# Patient Record
Sex: Male | Born: 1990 | Race: Black or African American | Hispanic: No | Marital: Single | Smoking: Current some day smoker
Health system: Southern US, Community
[De-identification: ages and names within clinical notes are randomized; demographics above are authoritative.]

---

## 2019-09-10 ENCOUNTER — Emergency Department (HOSPITAL_COMMUNITY): Payer: Self-pay | Admitting: Anesthesiology

## 2019-09-10 ENCOUNTER — Inpatient Hospital Stay (HOSPITAL_COMMUNITY)
Admission: EM | Admit: 2019-09-10 | Discharge: 2019-09-19 | DRG: 163 | Disposition: A | Discharge status: Home / Self Care | Payer: Self-pay | Attending: General Surgery | Admitting: General Surgery

## 2019-09-10 ENCOUNTER — Encounter (HOSPITAL_COMMUNITY): Admission: EM | Disposition: A | Discharge status: Home / Self Care | Payer: Self-pay | Source: Home / Self Care

## 2019-09-10 ENCOUNTER — Emergency Department (HOSPITAL_COMMUNITY): Payer: Self-pay

## 2019-09-10 DIAGNOSIS — J9690 Respiratory failure, unspecified, unspecified whether with hypoxia or hypercapnia: Secondary | ICD-10-CM

## 2019-09-10 DIAGNOSIS — S25811A Laceration of other blood vessels of thorax, right side, initial encounter: Secondary | ICD-10-CM | POA: Diagnosis present

## 2019-09-10 DIAGNOSIS — Z9689 Presence of other specified functional implants: Secondary | ICD-10-CM

## 2019-09-10 DIAGNOSIS — R000 Tachycardia, unspecified: Secondary | ICD-10-CM | POA: Diagnosis not present

## 2019-09-10 DIAGNOSIS — D62 Acute posthemorrhagic anemia: Secondary | ICD-10-CM | POA: Diagnosis present

## 2019-09-10 DIAGNOSIS — S27301A Unspecified injury of lung, unilateral, initial encounter: Secondary | ICD-10-CM

## 2019-09-10 DIAGNOSIS — D696 Thrombocytopenia, unspecified: Secondary | ICD-10-CM | POA: Diagnosis present

## 2019-09-10 DIAGNOSIS — R1110 Vomiting, unspecified: Secondary | ICD-10-CM | POA: Diagnosis not present

## 2019-09-10 DIAGNOSIS — J9601 Acute respiratory failure with hypoxia: Secondary | ICD-10-CM | POA: Diagnosis present

## 2019-09-10 DIAGNOSIS — R451 Restlessness and agitation: Secondary | ICD-10-CM | POA: Diagnosis present

## 2019-09-10 DIAGNOSIS — R404 Transient alteration of awareness: Secondary | ICD-10-CM | POA: Diagnosis present

## 2019-09-10 DIAGNOSIS — W3400XS Accidental discharge from unspecified firearms or gun, sequela: Secondary | ICD-10-CM

## 2019-09-10 DIAGNOSIS — S21139S Puncture wound without foreign body of unspecified front wall of thorax without penetration into thoracic cavity, sequela: Secondary | ICD-10-CM

## 2019-09-10 DIAGNOSIS — S21331A Puncture wound without foreign body of right front wall of thorax with penetration into thoracic cavity, initial encounter: Secondary | ICD-10-CM | POA: Diagnosis present

## 2019-09-10 DIAGNOSIS — R61 Generalized hyperhidrosis: Secondary | ICD-10-CM | POA: Diagnosis present

## 2019-09-10 DIAGNOSIS — T794XXA Traumatic shock, initial encounter: Secondary | ICD-10-CM | POA: Diagnosis present

## 2019-09-10 DIAGNOSIS — Z9911 Dependence on respirator [ventilator] status: Secondary | ICD-10-CM

## 2019-09-10 DIAGNOSIS — S271XXA Traumatic hemothorax, initial encounter: Secondary | ICD-10-CM | POA: Diagnosis present

## 2019-09-10 DIAGNOSIS — J942 Hemothorax: Secondary | ICD-10-CM

## 2019-09-10 DIAGNOSIS — Z4659 Encounter for fitting and adjustment of other gastrointestinal appliance and device: Secondary | ICD-10-CM

## 2019-09-10 DIAGNOSIS — S21131A Puncture wound without foreign body of right front wall of thorax without penetration into thoracic cavity, initial encounter: Secondary | ICD-10-CM

## 2019-09-10 DIAGNOSIS — Z20822 Contact with and (suspected) exposure to covid-19: Secondary | ICD-10-CM | POA: Diagnosis present

## 2019-09-10 DIAGNOSIS — Z9889 Other specified postprocedural states: Secondary | ICD-10-CM

## 2019-09-10 DIAGNOSIS — S27331A Laceration of lung, unilateral, initial encounter: Principal | ICD-10-CM | POA: Diagnosis present

## 2019-09-10 DIAGNOSIS — J189 Pneumonia, unspecified organism: Secondary | ICD-10-CM | POA: Diagnosis not present

## 2019-09-10 DIAGNOSIS — J9584 Transfusion-related acute lung injury (TRALI): Secondary | ICD-10-CM | POA: Diagnosis not present

## 2019-09-10 DIAGNOSIS — D689 Coagulation defect, unspecified: Secondary | ICD-10-CM | POA: Diagnosis present

## 2019-09-10 DIAGNOSIS — W3400XA Accidental discharge from unspecified firearms or gun, initial encounter: Secondary | ICD-10-CM

## 2019-09-10 DIAGNOSIS — T1490XA Injury, unspecified, initial encounter: Secondary | ICD-10-CM

## 2019-09-10 DIAGNOSIS — E876 Hypokalemia: Secondary | ICD-10-CM | POA: Diagnosis present

## 2019-09-10 DIAGNOSIS — E873 Alkalosis: Secondary | ICD-10-CM | POA: Diagnosis present

## 2019-09-10 DIAGNOSIS — J9602 Acute respiratory failure with hypercapnia: Secondary | ICD-10-CM | POA: Diagnosis present

## 2019-09-10 DIAGNOSIS — J939 Pneumothorax, unspecified: Secondary | ICD-10-CM

## 2019-09-10 DIAGNOSIS — S2231XA Fracture of one rib, right side, initial encounter for closed fracture: Secondary | ICD-10-CM | POA: Diagnosis present

## 2019-09-10 HISTORY — PX: THORACOTOMY: SHX5074

## 2019-09-10 HISTORY — PX: LOBECTOMY: SHX5089

## 2019-09-10 LAB — I-STAT CHEM 8, ED
BUN: 11 mg/dL (ref 8–23)
Calcium, Ion: 0.9 mmol/L — ABNORMAL LOW (ref 1.15–1.40)
Chloride: 109 mmol/L (ref 98–111)
Creatinine, Ser: 1.3 mg/dL — ABNORMAL HIGH (ref 0.61–1.24)
Glucose, Bld: 184 mg/dL — ABNORMAL HIGH (ref 70–99)
HCT: 30 % — ABNORMAL LOW (ref 39.0–52.0)
Hemoglobin: 10.2 g/dL — ABNORMAL LOW (ref 13.0–17.0)
Potassium: 3.7 mmol/L (ref 3.5–5.1)
Sodium: 139 mmol/L (ref 135–145)
TCO2: 15 mmol/L — ABNORMAL LOW (ref 22–32)

## 2019-09-10 LAB — RESPIRATORY PANEL BY RT PCR (FLU A&B, COVID)
Influenza A by PCR: NEGATIVE
Influenza B by PCR: NEGATIVE
SARS Coronavirus 2 by RT PCR: NEGATIVE

## 2019-09-10 SURGERY — THORACOTOMY, MAJOR
Anesthesia: General

## 2019-09-10 MED ORDER — CALCIUM CHLORIDE 10 % IV SOLN
INTRAVENOUS | Status: DC | PRN
  Administered 2019-09-10: 400 mg via INTRAVENOUS
  Administered 2019-09-10 (×2): 300 mg via INTRAVENOUS
  Administered 2019-09-10 (×2): 400 mg via INTRAVENOUS
  Administered 2019-09-10: 300 mg via INTRAVENOUS
  Administered 2019-09-10: 400 mg via INTRAVENOUS

## 2019-09-10 MED ORDER — KETAMINE HCL 50 MG/5ML IJ SOSY
PREFILLED_SYRINGE | INTRAMUSCULAR | Status: AC
  Filled 2019-09-10: qty 10

## 2019-09-10 MED ORDER — FENTANYL CITRATE (PF) 250 MCG/5ML IJ SOLN
INTRAMUSCULAR | Status: AC
  Filled 2019-09-10: qty 5

## 2019-09-10 MED ORDER — HEMOSTATIC AGENTS (NO CHARGE) OPTIME
TOPICAL | Status: DC | PRN
  Administered 2019-09-10 (×2): 1 via TOPICAL

## 2019-09-10 MED ORDER — FENTANYL CITRATE (PF) 100 MCG/2ML IJ SOLN
INTRAMUSCULAR | Status: AC
  Filled 2019-09-10: qty 2

## 2019-09-10 MED ORDER — VASOPRESSIN 20 UNIT/ML IV SOLN
INTRAVENOUS | Status: DC | PRN
  Administered 2019-09-10: 2 [IU] via INTRAVENOUS

## 2019-09-10 MED ORDER — VASOPRESSIN 20 UNIT/ML IV SOLN
INTRAVENOUS | Status: DC | PRN
  Administered 2019-09-10: .03 [IU]/min via INTRAVENOUS

## 2019-09-10 MED ORDER — ROCURONIUM BROMIDE 100 MG/10ML IV SOLN
INTRAVENOUS | Status: DC | PRN
  Administered 2019-09-10: 50 mg via INTRAVENOUS
  Administered 2019-09-10 (×2): 100 mg via INTRAVENOUS
  Administered 2019-09-10: 50 mg via INTRAVENOUS

## 2019-09-10 MED ORDER — SUCCINYLCHOLINE CHLORIDE 20 MG/ML IJ SOLN
INTRAMUSCULAR | Status: AC | PRN
  Administered 2019-09-10: 100 mg via INTRAVENOUS

## 2019-09-10 MED ORDER — SODIUM CHLORIDE 0.9 % IV SOLN
INTRAVENOUS | Status: AC | PRN
  Administered 2019-09-10: 1000 mL via INTRAVENOUS

## 2019-09-10 MED ORDER — SODIUM BICARBONATE 8.4 % IV SOLN
INTRAVENOUS | Status: DC | PRN
  Administered 2019-09-10 (×4): 50 meq via INTRAVENOUS

## 2019-09-10 MED ORDER — FENTANYL CITRATE (PF) 250 MCG/5ML IJ SOLN
INTRAMUSCULAR | Status: DC | PRN
  Administered 2019-09-10 (×3): 50 ug via INTRAVENOUS
  Administered 2019-09-10: 100 ug via INTRAVENOUS

## 2019-09-10 MED ORDER — CEFAZOLIN SODIUM-DEXTROSE 2-3 GM-%(50ML) IV SOLR
INTRAVENOUS | Status: DC | PRN
  Administered 2019-09-10: 2 g via INTRAVENOUS

## 2019-09-10 MED ORDER — SODIUM CHLORIDE 0.9 % IV SOLN: INTRAVENOUS | Status: DC | PRN

## 2019-09-10 MED ORDER — NOREPINEPHRINE 4 MG/250ML-% IV SOLN
INTRAVENOUS | Status: DC | PRN
  Administered 2019-09-10: 10 ug/min via INTRAVENOUS

## 2019-09-10 MED ORDER — MIDAZOLAM HCL 2 MG/2ML IJ SOLN
INTRAMUSCULAR | Status: AC
  Filled 2019-09-10: qty 2

## 2019-09-10 MED ORDER — VECURONIUM BROMIDE 10 MG IV SOLR
INTRAVENOUS | Status: AC
  Filled 2019-09-10: qty 10

## 2019-09-10 MED ORDER — IOHEXOL 300 MG/ML  SOLN
100.0000 mL | Freq: Once | INTRAMUSCULAR | Status: AC | PRN
  Administered 2019-09-10: 100 mL via INTRAVENOUS

## 2019-09-10 MED ORDER — COAGULATION FACTOR VIIA RECOMB 1 MG IV SOLR
90.0000 ug/kg | Freq: Once | INTRAVENOUS | Status: AC
  Administered 2019-09-10: 6120 ug via INTRAVENOUS
  Filled 2019-09-10: qty 6

## 2019-09-10 MED ORDER — VASOPRESSIN 20 UNIT/ML IV SOLN
INTRAVENOUS | Status: AC
  Filled 2019-09-10: qty 1

## 2019-09-10 MED ORDER — MIDAZOLAM HCL 2 MG/2ML IJ SOLN
INTRAMUSCULAR | Status: AC
  Filled 2019-09-10: qty 4

## 2019-09-10 MED ORDER — EPINEPHRINE 1 MG/10ML IJ SOSY
PREFILLED_SYRINGE | INTRAMUSCULAR | Status: AC | PRN
  Administered 2019-09-10: 1 via INTRAVENOUS

## 2019-09-10 MED ORDER — MIDAZOLAM HCL 5 MG/5ML IJ SOLN
INTRAMUSCULAR | Status: DC | PRN
  Administered 2019-09-10: 2 mg via INTRAVENOUS

## 2019-09-10 MED ORDER — MIDAZOLAM HCL 5 MG/5ML IJ SOLN
INTRAMUSCULAR | Status: AC | PRN
  Administered 2019-09-10: 2 mg via INTRAVENOUS

## 2019-09-10 MED ORDER — LACTATED RINGERS IV SOLN: INTRAVENOUS | Status: DC | PRN

## 2019-09-10 MED ORDER — FENTANYL CITRATE (PF) 100 MCG/2ML IJ SOLN
INTRAMUSCULAR | Status: AC | PRN
  Administered 2019-09-10: 100 ug via INTRAVENOUS

## 2019-09-10 MED ORDER — 0.9 % SODIUM CHLORIDE (POUR BTL) OPTIME
TOPICAL | Status: DC | PRN
  Administered 2019-09-10: 3000 mL

## 2019-09-10 MED ORDER — EPINEPHRINE 1 MG/10ML IJ SOSY
PREFILLED_SYRINGE | INTRAMUSCULAR | Status: DC | PRN
  Administered 2019-09-10: .5 mg via INTRAVENOUS

## 2019-09-10 SURGICAL SUPPLY — 84 items
APPLIER CLIP ROT 10 11.4 M/L (STAPLE) ×4
BIT DRILL 7/64X5 DISP (BIT) IMPLANT
BLADE CLIPPER SURG (BLADE) IMPLANT
CANISTER SUCT 3000ML PPV (MISCELLANEOUS) ×16 IMPLANT
CATH THORACIC 28FR (CATHETERS) IMPLANT
CATH THORACIC 28FR RT ANG (CATHETERS) IMPLANT
CATH THORACIC 36FR (CATHETERS) IMPLANT
CATH THORACIC 36FR RT ANG (CATHETERS) IMPLANT
CLIP APPLIE ROT 10 11.4 M/L (STAPLE) ×2 IMPLANT
CLIP VESOCCLUDE MED 6/CT (CLIP) ×4 IMPLANT
CNTNR URN SCR LID CUP LEK RST (MISCELLANEOUS) ×2 IMPLANT
CONN ST 1/4X3/8  BEN (MISCELLANEOUS) ×4
CONN ST 1/4X3/8 BEN (MISCELLANEOUS) ×4 IMPLANT
CONN Y 3/8X3/8X3/8  BEN (MISCELLANEOUS) ×2
CONN Y 3/8X3/8X3/8 BEN (MISCELLANEOUS) ×2 IMPLANT
CONT SPEC 4OZ STRL OR WHT (MISCELLANEOUS) ×2
COVER MAYO STAND STRL (DRAPES) ×4 IMPLANT
DERMABOND ADVANCED (GAUZE/BANDAGES/DRESSINGS)
DERMABOND ADVANCED .7 DNX12 (GAUZE/BANDAGES/DRESSINGS) IMPLANT
ELECT BLADE 6.5 EXT (BLADE) ×8 IMPLANT
ELECT REM PT RETURN 9FT ADLT (ELECTROSURGICAL) ×4
ELECTRODE REM PT RTRN 9FT ADLT (ELECTROSURGICAL) ×2 IMPLANT
FELT TEFLON 1X6 (MISCELLANEOUS) ×8 IMPLANT
GAUZE PACKING IODOFORM 1/4X15 (PACKING) ×4 IMPLANT
GAUZE SPONGE 4X4 12PLY STRL (GAUZE/BANDAGES/DRESSINGS) ×4 IMPLANT
GLOVE BIO SURGEON STRL SZ 6.5 (GLOVE) ×3 IMPLANT
GLOVE BIO SURGEONS STRL SZ 6.5 (GLOVE) ×1
GLOVE BIOGEL PI IND STRL 6 (GLOVE) ×4 IMPLANT
GLOVE BIOGEL PI IND STRL 6.5 (GLOVE) ×6 IMPLANT
GLOVE BIOGEL PI INDICATOR 6 (GLOVE) ×4
GLOVE BIOGEL PI INDICATOR 6.5 (GLOVE) ×6
GLOVE SURG SIGNA 7.5 PF LTX (GLOVE) ×4 IMPLANT
GOWN STRL REUS W/ TWL LRG LVL3 (GOWN DISPOSABLE) ×12 IMPLANT
GOWN STRL REUS W/ TWL XL LVL3 (GOWN DISPOSABLE) ×2 IMPLANT
GOWN STRL REUS W/TWL LRG LVL3 (GOWN DISPOSABLE) ×12
GOWN STRL REUS W/TWL XL LVL3 (GOWN DISPOSABLE) ×2
INSERT FOGARTY 61MM (MISCELLANEOUS) IMPLANT
KIT BASIN OR (CUSTOM PROCEDURE TRAY) ×4 IMPLANT
KIT SUCTION CATH 14FR (SUCTIONS) IMPLANT
KIT TURNOVER KIT B (KITS) ×4 IMPLANT
NS IRRIG 1000ML POUR BTL (IV SOLUTION) ×16 IMPLANT
PACK CHEST (CUSTOM PROCEDURE TRAY) ×4 IMPLANT
PAD ARMBOARD 7.5X6 YLW CONV (MISCELLANEOUS) ×8 IMPLANT
PASSER SUT SWANSON 36MM LOOP (INSTRUMENTS) ×4 IMPLANT
RELOAD STAPLER 60MM BLK (STAPLE) ×12 IMPLANT
RELOAD STAPLER GREEN 60MM (STAPLE) ×14 IMPLANT
SEALANT PATCH FIBRIN 2X4IN (MISCELLANEOUS) ×8 IMPLANT
SEALANT SURG COSEAL 8ML (VASCULAR PRODUCTS) IMPLANT
SPONGE TONSIL TAPE 1 RFD (DISPOSABLE) ×4 IMPLANT
STAPLE ECHEON FLEX 60 POW ENDO (STAPLE) ×4 IMPLANT
STAPLE RELOAD 2.5MM WHITE (STAPLE) ×8 IMPLANT
STAPLER RELOAD 60MM BLK (STAPLE) ×24
STAPLER RELOAD GREEN 60MM (STAPLE) ×28
STAPLER VASCULAR ECHELON 35 (CUTTER) ×4 IMPLANT
STAPLER VISISTAT 35W (STAPLE) ×4 IMPLANT
STOPCOCK 4 WAY LG BORE MALE ST (IV SETS) IMPLANT
SUT PROLENE 2 0 MH 48 (SUTURE) ×32 IMPLANT
SUT PROLENE 2 0 SH DA (SUTURE) IMPLANT
SUT PROLENE 3 0 SH 48 (SUTURE) IMPLANT
SUT PROLENE 4 0 RB 1 (SUTURE) ×8
SUT PROLENE 4 0 SH DA (SUTURE) ×28 IMPLANT
SUT PROLENE 4-0 RB1 .5 CRCL 36 (SUTURE) ×8 IMPLANT
SUT SILK  1 MH (SUTURE) ×6
SUT SILK 1 MH (SUTURE) ×6 IMPLANT
SUT SILK 2 0SH CR/8 30 (SUTURE) ×8 IMPLANT
SUT VIC AB 1 CTX 18 (SUTURE) IMPLANT
SUT VIC AB 1 CTX 36 (SUTURE) ×8
SUT VIC AB 1 CTX36XBRD ANBCTR (SUTURE) ×8 IMPLANT
SUT VIC AB 2-0 CTX 36 (SUTURE) ×8 IMPLANT
SUT VIC AB 3-0 X1 27 (SUTURE) IMPLANT
SUT VICRYL 2 TP 1 (SUTURE) ×8 IMPLANT
SWAB CULTURE ESWAB REG 1ML (MISCELLANEOUS) IMPLANT
SYR 10ML LL (SYRINGE) IMPLANT
SYR 20ML LL LF (SYRINGE) IMPLANT
SYR 50ML LL SCALE MARK (SYRINGE) IMPLANT
SYSTEM SAHARA CHEST DRAIN ATS (WOUND CARE) IMPLANT
TAPE CLOTH SURG 4X10 WHT LF (GAUZE/BANDAGES/DRESSINGS) ×4 IMPLANT
TOWEL GREEN STERILE (TOWEL DISPOSABLE) ×8 IMPLANT
TRAY FOLEY SLVR 16FR LF STAT (SET/KITS/TRAYS/PACK) ×4 IMPLANT
TUBE CONNECTING 12'X1/4 (SUCTIONS) ×1
TUBE CONNECTING 12X1/4 (SUCTIONS) ×3 IMPLANT
TUBING EXTENTION W/L.L. (IV SETS) IMPLANT
WATER STERILE IRR 1000ML POUR (IV SOLUTION) ×4 IMPLANT
YANKAUER SUCT BULB TIP NO VENT (SUCTIONS) ×4 IMPLANT

## 2019-09-10 NOTE — ED Notes (Signed)
First unit of blood done

## 2019-09-10 NOTE — Anesthesia Preprocedure Evaluation (Signed)
Anesthesia Evaluation  Patient identified by MRN, date of birth, ID band  Reviewed: Unable to perform ROS - Chart review onlyPreop documentation limited or incomplete due to emergent nature of procedure.  Airway Mallampati: Intubated       Dental   Pulmonary     + decreased breath sounds      Cardiovascular  Rate:Tachycardia     Neuro/Psych    GI/Hepatic   Endo/Other    Renal/GU      Musculoskeletal   Abdominal Normal abdominal exam  (+)   Peds  Hematology   Anesthesia Other Findings   Reproductive/Obstetrics                             Anesthesia Physical Anesthesia Plan  ASA: IV and emergent  Anesthesia Plan: General   Post-op Pain Management:    Induction: Intravenous  PONV Risk Score and Plan: 1 and Ondansetron  Airway Management Planned:   Additional Equipment: Arterial line and CVP  Intra-op Plan:   Post-operative Plan: Post-operative intubation/ventilation  Informed Consent:     Only emergency history available and History available from chart only  Plan Discussed with: CRNA  Anesthesia Plan Comments:         Anesthesia Quick Evaluation
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
## 2019-09-10 NOTE — Procedures (Signed)
Central Venous Catheter Insertion Procedure Note Justin Morton [id number] 05/09/1874  Procedure: Insertion of Central Venous Catheter Indications: Hemorrhagic shock  Procedure Details The right groin was prepped with ChloraPrep.  Ultrasound was used to confirm vascular anatomy.  Using Seldinger Technique, a Cordis was placed in the right femoral vein.  This is secured to the skin with silk sutures and a dressing is applied.  This line was placed under duress and will need to be removed soon as possible.  [name] 09/10/2019, 11:08 PM
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
## 2019-09-10 NOTE — ED Notes (Signed)
MTP blood documentation  RBC Unit: [id number] 21 [id number] Z  [id number]  RBC Unit [id number] 21 [id number] W 2045-2046  Plasma Unit [id number] 21 [id number] J 2047-2049  Plasma Unit [id number] 21 [id number] N 2049-2049  RBC Unit [id number] 21 [id number] C 2049-2050  RBC Unit [id number] 21 [id number] 6 2050-2052  RBC Unit [id number] 21 [id number] M 2102-2105  Signed off by Pattricia Boss RN and Victorino Dike RN

## 2019-09-10 NOTE — ED Notes (Signed)
Pt to the OR.

## 2019-09-10 NOTE — ED Notes (Signed)
70mg  kedimine

## 2019-09-10 NOTE — ED Notes (Signed)
Cooler of blood here

## 2019-09-10 NOTE — ED Notes (Addendum)
Begin to start intubation:  100 Ketamine given
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
## 2019-09-10 NOTE — ED Notes (Addendum)
Started emergency release blood  [id number]

## 2019-09-10 NOTE — ED Notes (Signed)
Back from CT

## 2019-09-10 NOTE — ED Notes (Signed)
Second blood unit complete

## 2019-09-10 NOTE — ED Notes (Signed)
3rd unit of blood complete
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
## 2019-09-10 NOTE — Progress Notes (Signed)
Patient transported to OR.

## 2019-09-10 NOTE — ED Notes (Addendum)
Second unit of blood started [id number]

## 2019-09-10 NOTE — ED Notes (Addendum)
MTP ordered per Trauma Provider, called blood bank and Charge RN

## 2019-09-10 NOTE — Anesthesia Procedure Notes (Signed)
Arterial Line Insertion Start/End5/08/2019 9:18 PM, 09/10/2019 9:20 PM Performed by: Shelton Silvas, MD, anesthesiologist  Patient location: Pre-op. Preanesthetic checklist: patient identified, IV checked, site marked, risks and benefits discussed, surgical consent, monitors and equipment checked, pre-op evaluation, timeout performed and anesthesia consent Lidocaine 1% used for infiltration Left, radial was placed Catheter size: 20 Fr Hand hygiene performed  and maximum sterile barriers used   Attempts: 1 Procedure performed without using ultrasound guided technique. Following insertion, dressing applied. Post procedure assessment: normal and unchanged  Patient tolerated the procedure well with no immediate complications.
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
## 2019-09-10 NOTE — ED Notes (Signed)
Chest tube inserted on Right side

## 2019-09-10 NOTE — Consult Note (Signed)
Responded to page, pt unavailable, no family present, prayed for pt, staff will call again if further need of chaplain services.   Rev. Donnel Saxon Chaplain

## 2019-09-10 NOTE — ED Notes (Addendum)
Started 3rd until of blood [id number] , called blood bank for more.

## 2019-09-11 ENCOUNTER — Inpatient Hospital Stay (HOSPITAL_COMMUNITY): Payer: Self-pay

## 2019-09-11 LAB — POCT I-STAT 7, (LYTES, BLD GAS, ICA,H+H)
Acid-Base Excess: 0 mmol/L (ref 0.0–2.0)
Acid-Base Excess: 11 mmol/L — ABNORMAL HIGH (ref 0.0–2.0)
Acid-base deficit: 10 mmol/L — ABNORMAL HIGH (ref 0.0–2.0)
Acid-base deficit: 10 mmol/L — ABNORMAL HIGH (ref 0.0–2.0)
Acid-base deficit: 13 mmol/L — ABNORMAL HIGH (ref 0.0–2.0)
Acid-base deficit: 18 mmol/L — ABNORMAL HIGH (ref 0.0–2.0)
Acid-base deficit: 7 mmol/L — ABNORMAL HIGH (ref 0.0–2.0)
Bicarbonate: 14.6 mmol/L — ABNORMAL LOW (ref 20.0–28.0)
Bicarbonate: 20.3 mmol/L (ref 20.0–28.0)
Bicarbonate: 22 mmol/L (ref 20.0–28.0)
Bicarbonate: 22.9 mmol/L (ref 20.0–28.0)
Bicarbonate: 24.4 mmol/L (ref 20.0–28.0)
Bicarbonate: 26.8 mmol/L (ref 20.0–28.0)
Bicarbonate: 33.1 mmol/L — ABNORMAL HIGH (ref 20.0–28.0)
Calcium, Ion: 0.51 mmol/L — CL (ref 1.15–1.40)
Calcium, Ion: 0.76 mmol/L — CL (ref 1.15–1.40)
Calcium, Ion: 0.76 mmol/L — CL (ref 1.15–1.40)
Calcium, Ion: 0.84 mmol/L — CL (ref 1.15–1.40)
Calcium, Ion: 1.4 mmol/L (ref 1.15–1.40)
Calcium, Ion: <0.3 mmol/L — CL (ref 1.15–1.40)
Calcium, Ion: 1.26 mmol/L (ref 1.15–1.40)
HCT: 25 % — ABNORMAL LOW (ref 39.0–52.0)
HCT: 33 % — ABNORMAL LOW (ref 39.0–52.0)
HCT: 34 % — ABNORMAL LOW (ref 39.0–52.0)
HCT: 34 % — ABNORMAL LOW (ref 39.0–52.0)
HCT: 36 % — ABNORMAL LOW (ref 39.0–52.0)
HCT: 36 % — ABNORMAL LOW (ref 39.0–52.0)
HCT: 23 % — ABNORMAL LOW (ref 39.0–52.0)
Hemoglobin: 11.2 g/dL — ABNORMAL LOW (ref 13.0–17.0)
Hemoglobin: 11.6 g/dL — ABNORMAL LOW (ref 13.0–17.0)
Hemoglobin: 11.6 g/dL — ABNORMAL LOW (ref 13.0–17.0)
Hemoglobin: 12.2 g/dL — ABNORMAL LOW (ref 13.0–17.0)
Hemoglobin: 12.2 g/dL — ABNORMAL LOW (ref 13.0–17.0)
Hemoglobin: 8.5 g/dL — ABNORMAL LOW (ref 13.0–17.0)
Hemoglobin: 7.8 g/dL — ABNORMAL LOW (ref 13.0–17.0)
O2 Saturation: 56 %
O2 Saturation: 75 %
O2 Saturation: 80 %
O2 Saturation: 82 %
O2 Saturation: 83 %
O2 Saturation: 87 %
O2 Saturation: 100 %
Patient temperature: 33.5
Patient temperature: 33.6
Patient temperature: 34
Patient temperature: 34
Patient temperature: 34.5
Patient temperature: 36.8
Potassium: 4.3 mmol/L (ref 3.5–5.1)
Potassium: 5 mmol/L (ref 3.5–5.1)
Potassium: 5.1 mmol/L (ref 3.5–5.1)
Potassium: 6 mmol/L — ABNORMAL HIGH (ref 3.5–5.1)
Potassium: 6.2 mmol/L — ABNORMAL HIGH (ref 3.5–5.1)
Potassium: 6.9 mmol/L (ref 3.5–5.1)
Potassium: 3.5 mmol/L (ref 3.5–5.1)
Sodium: 141 mmol/L (ref 135–145)
Sodium: 146 mmol/L — ABNORMAL HIGH (ref 135–145)
Sodium: 147 mmol/L — ABNORMAL HIGH (ref 135–145)
Sodium: 148 mmol/L — ABNORMAL HIGH (ref 135–145)
Sodium: 149 mmol/L — ABNORMAL HIGH (ref 135–145)
Sodium: 150 mmol/L — ABNORMAL HIGH (ref 135–145)
Sodium: 148 mmol/L — ABNORMAL HIGH (ref 135–145)
TCO2: 17 mmol/L — ABNORMAL LOW (ref 22–32)
TCO2: 23 mmol/L (ref 22–32)
TCO2: 25 mmol/L (ref 22–32)
TCO2: 26 mmol/L (ref 22–32)
TCO2: 27 mmol/L (ref 22–32)
TCO2: 28 mmol/L (ref 22–32)
TCO2: 34 mmol/L — ABNORMAL HIGH (ref 22–32)
pCO2 arterial: 48.5 mmHg — ABNORMAL HIGH (ref 32.0–48.0)
pCO2 arterial: 70.2 mmHg (ref 32.0–48.0)
pCO2 arterial: 73.9 mmHg (ref 32.0–48.0)
pCO2 arterial: 76.5 mmHg (ref 32.0–48.0)
pCO2 arterial: 81.2 mmHg (ref 32.0–48.0)
pCO2 arterial: 85.1 mmHg (ref 32.0–48.0)
pCO2 arterial: 33.2 mmHg (ref 32.0–48.0)
pH, Arterial: 6.926 — CL (ref 7.350–7.450)
pH, Arterial: 6.967 — CL (ref 7.350–7.450)
pH, Arterial: 7.017 — CL (ref 7.350–7.450)
pH, Arterial: 7.06 — CL (ref 7.350–7.450)
pH, Arterial: 7.108 — CL (ref 7.350–7.450)
pH, Arterial: 7.336 — ABNORMAL LOW (ref 7.350–7.450)
pH, Arterial: 7.606 (ref 7.350–7.450)
pO2, Arterial: 41 mmHg — ABNORMAL LOW (ref 83.0–108.0)
pO2, Arterial: 49 mmHg — ABNORMAL LOW (ref 83.0–108.0)
pO2, Arterial: 55 mmHg — ABNORMAL LOW (ref 83.0–108.0)
pO2, Arterial: 56 mmHg — ABNORMAL LOW (ref 83.0–108.0)
pO2, Arterial: 56 mmHg — ABNORMAL LOW (ref 83.0–108.0)
pO2, Arterial: 65 mmHg — ABNORMAL LOW (ref 83.0–108.0)
pO2, Arterial: 268 mmHg — ABNORMAL HIGH (ref 83.0–108.0)

## 2019-09-11 LAB — GLUCOSE, CAPILLARY
Glucose-Capillary: 123 mg/dL — ABNORMAL HIGH (ref 70–99)
Glucose-Capillary: 127 mg/dL — ABNORMAL HIGH (ref 70–99)
Glucose-Capillary: 154 mg/dL — ABNORMAL HIGH (ref 70–99)
Glucose-Capillary: 188 mg/dL — ABNORMAL HIGH (ref 70–99)
Glucose-Capillary: 56 mg/dL — ABNORMAL LOW (ref 70–99)
Glucose-Capillary: 56 mg/dL — ABNORMAL LOW (ref 70–99)
Glucose-Capillary: 93 mg/dL (ref 70–99)

## 2019-09-11 LAB — POCT I-STAT, CHEM 8
BUN: 11 mg/dL (ref 6–20)
Calcium, Ion: 0.9 mmol/L — ABNORMAL LOW (ref 1.15–1.40)
Chloride: 109 mmol/L (ref 98–111)
Creatinine, Ser: 1.3 mg/dL — ABNORMAL HIGH (ref 0.61–1.24)
Glucose, Bld: 184 mg/dL — ABNORMAL HIGH (ref 70–99)
HCT: 30 % — ABNORMAL LOW (ref 39.0–52.0)
Hemoglobin: 10.2 g/dL — ABNORMAL LOW (ref 13.0–17.0)
Potassium: 3.7 mmol/L (ref 3.5–5.1)
Sodium: 139 mmol/L (ref 135–145)
TCO2: 15 mmol/L — ABNORMAL LOW (ref 22–32)

## 2019-09-11 LAB — COMPREHENSIVE METABOLIC PANEL
ALT: 64 U/L — ABNORMAL HIGH (ref 0–44)
AST: 101 U/L — ABNORMAL HIGH (ref 15–41)
Albumin: 2.4 g/dL — ABNORMAL LOW (ref 3.5–5.0)
Alkaline Phosphatase: 42 U/L (ref 38–126)
Anion gap: 10 (ref 5–15)
BUN: 9 mg/dL (ref 6–20)
CO2: 27 mmol/L (ref 22–32)
Calcium: 10.8 mg/dL — ABNORMAL HIGH (ref 8.9–10.3)
Chloride: 113 mmol/L — ABNORMAL HIGH (ref 98–111)
Creatinine, Ser: 0.9 mg/dL (ref 0.61–1.24)
GFR calc Af Amer: >60 mL/min (ref >60)
GFR calc non Af Amer: >60 mL/min (ref >60)
Glucose, Bld: 172 mg/dL — ABNORMAL HIGH (ref 70–99)
Potassium: 5.7 mmol/L — ABNORMAL HIGH (ref 3.5–5.1)
Sodium: 150 mmol/L — ABNORMAL HIGH (ref 135–145)
Total Bilirubin: 0.9 mg/dL (ref 0.3–1.2)
Total Protein: 4.3 g/dL — ABNORMAL LOW (ref 6.5–8.1)

## 2019-09-11 LAB — CBC
HCT: 36.8 % — ABNORMAL LOW (ref 39.0–52.0)
HCT: 27.8 % — ABNORMAL LOW (ref 39.0–52.0)
HCT: 24.7 % — ABNORMAL LOW (ref 39.0–52.0)
Hemoglobin: 12.3 g/dL — ABNORMAL LOW (ref 13.0–17.0)
Hemoglobin: 9.7 g/dL — ABNORMAL LOW (ref 13.0–17.0)
Hemoglobin: 8.5 g/dL — ABNORMAL LOW (ref 13.0–17.0)
MCH: 28.9 pg (ref 26.0–34.0)
MCH: 28.9 pg (ref 26.0–34.0)
MCH: 28.5 pg (ref 26.0–34.0)
MCHC: 33.4 g/dL (ref 30.0–36.0)
MCHC: 34.9 g/dL (ref 30.0–36.0)
MCHC: 34.4 g/dL (ref 30.0–36.0)
MCV: 86.4 fL (ref 80.0–100.0)
MCV: 82.7 fL (ref 80.0–100.0)
MCV: 82.9 fL (ref 80.0–100.0)
Platelets: 94 10*3/uL — ABNORMAL LOW (ref 150–400)
Platelets: 78 10*3/uL — ABNORMAL LOW (ref 150–400)
Platelets: 141 10*3/uL — ABNORMAL LOW (ref 150–400)
RBC: 4.26 MIL/uL (ref 4.22–5.81)
RBC: 3.36 MIL/uL — ABNORMAL LOW (ref 4.22–5.81)
RBC: 2.98 MIL/uL — ABNORMAL LOW (ref 4.22–5.81)
RDW: 14.8 % (ref 11.5–15.5)
RDW: 14.6 % (ref 11.5–15.5)
RDW: 15.1 % (ref 11.5–15.5)
WBC: 8.2 10*3/uL (ref 4.0–10.5)
WBC: 9 10*3/uL (ref 4.0–10.5)
WBC: 12.3 10*3/uL — ABNORMAL HIGH (ref 4.0–10.5)
nRBC: 0 % (ref 0.0–0.2)
nRBC: 0.2 % (ref 0.0–0.2)
nRBC: 0 % (ref 0.0–0.2)

## 2019-09-11 LAB — DIC (DISSEMINATED INTRAVASCULAR COAGULATION)PANEL
D-Dimer, Quant: >20 ug/mL-FEU — ABNORMAL HIGH (ref 0.00–0.50)
Fibrinogen: 305 mg/dL (ref 210–475)
INR: 0.9 (ref 0.8–1.2)
Platelets: 96 10*3/uL — ABNORMAL LOW (ref 150–400)
Prothrombin Time: 11.4 seconds (ref 11.4–15.2)
Smear Review: NONE SEEN
aPTT: 47 seconds — ABNORMAL HIGH (ref 24–36)

## 2019-09-11 LAB — BASIC METABOLIC PANEL
Anion gap: 8 (ref 5–15)
BUN: 9 mg/dL (ref 6–20)
CO2: 27 mmol/L (ref 22–32)
Calcium: 9.8 mg/dL (ref 8.9–10.3)
Chloride: 115 mmol/L — ABNORMAL HIGH (ref 98–111)
Creatinine, Ser: 1.07 mg/dL (ref 0.61–1.24)
GFR calc Af Amer: >60 mL/min (ref >60)
GFR calc non Af Amer: >60 mL/min (ref >60)
Glucose, Bld: 92 mg/dL (ref 70–99)
Potassium: 3.4 mmol/L — ABNORMAL LOW (ref 3.5–5.1)
Sodium: 150 mmol/L — ABNORMAL HIGH (ref 135–145)

## 2019-09-11 LAB — GLOBAL TEG PANEL
CFF Max Amplitude: 21.6 mm (ref 15–32)
CK with Heparinase (R): 4.5 min (ref 4.3–8.3)
Citrated Functional Fibrinogen: 394.2 mg/dL (ref 278–581)
Citrated Kaolin (K): 0.9 min (ref 0.8–2.1)
Citrated Kaolin (MA): 61.9 mm (ref 52–69)
Citrated Kaolin (R): 4.7 min (ref 4.6–9.1)
Citrated Kaolin Angle: 76.5 deg (ref 63–78)
Citrated Rapid TEG (MA): 60.3 mm (ref 52–70)

## 2019-09-11 LAB — MRSA PCR SCREENING: MRSA by PCR: NEGATIVE

## 2019-09-11 LAB — TRIGLYCERIDES: Triglycerides: 100 mg/dL (ref <150)

## 2019-09-11 LAB — ABO/RH: ABO/RH(D): O POS

## 2019-09-11 MED ORDER — SODIUM CHLORIDE 0.9% IV SOLUTION: Freq: Once | INTRAVENOUS | Status: AC

## 2019-09-11 MED ORDER — ONDANSETRON HCL 4 MG/2ML IJ SOLN
4.0000 mg | Freq: Four times a day (QID) | INTRAMUSCULAR | Status: DC | PRN
  Administered 2019-09-14 – 2019-09-15 (×2): 4 mg via INTRAVENOUS
  Filled 2019-09-11 (×2): qty 2

## 2019-09-11 MED ORDER — SUCCINYLCHOLINE CHLORIDE 200 MG/10ML IV SOSY
PREFILLED_SYRINGE | INTRAVENOUS | Status: AC
  Filled 2019-09-11: qty 10

## 2019-09-11 MED ORDER — ROCURONIUM BROMIDE 10 MG/ML (PF) SYRINGE
PREFILLED_SYRINGE | INTRAVENOUS | Status: AC
  Filled 2019-09-11: qty 50

## 2019-09-11 MED ORDER — POTASSIUM CHLORIDE 20 MEQ/15ML (10%) PO SOLN
20.0000 meq | Freq: Every day | ORAL | Status: DC
  Filled 2019-09-11: qty 15

## 2019-09-11 MED ORDER — VITAL HIGH PROTEIN PO LIQD
1000.0000 mL | ORAL | Status: DC
Start: 2019-09-11 — End: 2019-09-11

## 2019-09-11 MED ORDER — POTASSIUM CHLORIDE 20 MEQ PO PACK
20.0000 meq | PACK | Freq: Once | ORAL | Status: DC
  Filled 2019-09-11 (×2): qty 1

## 2019-09-11 MED ORDER — POTASSIUM CHLORIDE 20 MEQ/15ML (10%) PO SOLN
20.0000 meq | Freq: Once | ORAL | Status: AC
  Administered 2019-09-11: 20 meq

## 2019-09-11 MED ORDER — IPRATROPIUM-ALBUTEROL 0.5-2.5 (3) MG/3ML IN SOLN
3.0000 mL | Freq: Four times a day (QID) | RESPIRATORY_TRACT | Status: DC
  Administered 2019-09-11 – 2019-09-13 (×11): 3 mL via RESPIRATORY_TRACT
  Filled 2019-09-11 (×11): qty 3

## 2019-09-11 MED ORDER — EPINEPHRINE 1 MG/10ML IJ SOSY
PREFILLED_SYRINGE | INTRAMUSCULAR | Status: AC
  Filled 2019-09-11: qty 20

## 2019-09-11 MED ORDER — FENTANYL 2500MCG IN NS 250ML (10MCG/ML) PREMIX INFUSION
50.0000 ug/h | INTRAVENOUS | Status: DC
  Administered 2019-09-11 (×2): 150 ug/h via INTRAVENOUS
  Administered 2019-09-12 – 2019-09-14 (×3): 200 ug/h via INTRAVENOUS
  Filled 2019-09-11 (×7): qty 250

## 2019-09-11 MED ORDER — FENTANYL CITRATE (PF) 100 MCG/2ML IJ SOLN: 50.0000 ug | Freq: Once | INTRAMUSCULAR | Status: AC

## 2019-09-11 MED ORDER — FUROSEMIDE 10 MG/ML IJ SOLN
40.0000 mg | Freq: Once | INTRAMUSCULAR | Status: AC
  Administered 2019-09-11: 40 mg via INTRAVENOUS
  Filled 2019-09-11: qty 4

## 2019-09-11 MED ORDER — SODIUM BICARBONATE 8.4 % IV SOLN
INTRAVENOUS | Status: AC
  Filled 2019-09-11: qty 50

## 2019-09-11 MED ORDER — SODIUM CHLORIDE 0.9 % IV SOLN: INTRAVENOUS | Status: DC

## 2019-09-11 MED ORDER — PRO-STAT SUGAR FREE PO LIQD
60.0000 mL | Freq: Two times a day (BID) | ORAL | Status: DC
  Administered 2019-09-11 – 2019-09-15 (×8): 60 mL
  Filled 2019-09-11 (×8): qty 60

## 2019-09-11 MED ORDER — PIVOT 1.5 CAL PO LIQD
1000.0000 mL | ORAL | Status: DC
  Administered 2019-09-11 – 2019-09-13 (×3): 1000 mL
  Filled 2019-09-11 (×6): qty 1000

## 2019-09-11 MED ORDER — CHLORHEXIDINE GLUCONATE 0.12% ORAL RINSE (MEDLINE KIT)
15.0000 mL | Freq: Two times a day (BID) | OROMUCOSAL | Status: DC
  Administered 2019-09-11 – 2019-09-15 (×9): 15 mL via OROMUCOSAL

## 2019-09-11 MED ORDER — PRO-STAT SUGAR FREE PO LIQD
30.0000 mL | Freq: Two times a day (BID) | ORAL | Status: DC
  Filled 2019-09-11: qty 30

## 2019-09-11 MED ORDER — SODIUM CHLORIDE 0.9% FLUSH: 10.0000 mL | INTRAVENOUS | Status: DC | PRN

## 2019-09-11 MED ORDER — PANTOPRAZOLE SODIUM 40 MG PO TBEC
40.0000 mg | DELAYED_RELEASE_TABLET | Freq: Every day | ORAL | Status: DC
  Administered 2019-09-12: 40 mg via ORAL
  Filled 2019-09-11 (×2): qty 1

## 2019-09-11 MED ORDER — ORAL CARE MOUTH RINSE
15.0000 mL | OROMUCOSAL | Status: DC
  Administered 2019-09-11 – 2019-09-16 (×48): 15 mL via OROMUCOSAL

## 2019-09-11 MED ORDER — PHENYLEPHRINE 40 MCG/ML (10ML) SYRINGE FOR IV PUSH (FOR BLOOD PRESSURE SUPPORT)
PREFILLED_SYRINGE | INTRAVENOUS | Status: AC
  Filled 2019-09-11: qty 10

## 2019-09-11 MED ORDER — ENOXAPARIN SODIUM 30 MG/0.3ML ~~LOC~~ SOLN
30.0000 mg | Freq: Two times a day (BID) | SUBCUTANEOUS | Status: DC
  Administered 2019-09-12 – 2019-09-18 (×14): 30 mg via SUBCUTANEOUS
  Filled 2019-09-11 (×15): qty 0.3

## 2019-09-11 MED ORDER — DEXTROSE 50 % IV SOLN
25.0000 mL | Freq: Once | INTRAVENOUS | Status: AC
  Administered 2019-09-11: 25 mL via INTRAVENOUS

## 2019-09-11 MED ORDER — POTASSIUM CHLORIDE 10 MEQ/100ML IV SOLN
10.0000 meq | INTRAVENOUS | Status: AC
  Administered 2019-09-11 (×4): 10 meq via INTRAVENOUS
  Filled 2019-09-11 (×4): qty 100

## 2019-09-11 MED ORDER — CALCIUM CHLORIDE 10 % IV SOLN
INTRAVENOUS | Status: AC
  Filled 2019-09-11: qty 20

## 2019-09-11 MED ORDER — SODIUM CHLORIDE 0.9% FLUSH
10.0000 mL | Freq: Two times a day (BID) | INTRAVENOUS | Status: DC
  Administered 2019-09-11 – 2019-09-12 (×4): 10 mL
  Administered 2019-09-12: 30 mL
  Administered 2019-09-13 – 2019-09-19 (×8): 10 mL

## 2019-09-11 MED ORDER — FENTANYL BOLUS VIA INFUSION
50.0000 ug | INTRAVENOUS | Status: DC | PRN
  Filled 2019-09-11: qty 50

## 2019-09-11 MED ORDER — ACETAMINOPHEN 500 MG PO TABS
1000.0000 mg | ORAL_TABLET | Freq: Four times a day (QID) | ORAL | Status: DC
  Administered 2019-09-11 – 2019-09-12 (×5): 1000 mg
  Filled 2019-09-11 (×3): qty 2

## 2019-09-11 MED ORDER — CEFAZOLIN SODIUM-DEXTROSE 2-4 GM/100ML-% IV SOLN
2.0000 g | Freq: Three times a day (TID) | INTRAVENOUS | Status: AC
  Administered 2019-09-11 – 2019-09-12 (×6): 2 g via INTRAVENOUS
  Filled 2019-09-11 (×9): qty 100

## 2019-09-11 MED ORDER — ONDANSETRON 4 MG PO TBDP
4.0000 mg | ORAL_TABLET | Freq: Four times a day (QID) | ORAL | Status: DC | PRN
  Filled 2019-09-11: qty 1

## 2019-09-11 MED ORDER — ARTIFICIAL TEARS OPHTHALMIC OINT
TOPICAL_OINTMENT | OPHTHALMIC | Status: AC
  Filled 2019-09-11: qty 3.5

## 2019-09-11 MED ORDER — LIDOCAINE 2% (20 MG/ML) 5 ML SYRINGE
INTRAMUSCULAR | Status: AC
  Filled 2019-09-11: qty 5

## 2019-09-11 MED ORDER — LACTATED RINGERS IV SOLN: INTRAVENOUS | Status: DC

## 2019-09-11 MED ORDER — DEXTROSE 50 % IV SOLN
INTRAVENOUS | Status: AC
  Administered 2019-09-11: 50 mL
  Filled 2019-09-11: qty 50

## 2019-09-11 MED ORDER — PANTOPRAZOLE SODIUM 40 MG IV SOLR
40.0000 mg | Freq: Every day | INTRAVENOUS | Status: DC
  Administered 2019-09-11 – 2019-09-15 (×4): 40 mg via INTRAVENOUS
  Filled 2019-09-11 (×4): qty 40

## 2019-09-11 MED ORDER — PROPOFOL 1000 MG/100ML IV EMUL
5.0000 ug/kg/min | INTRAVENOUS | Status: DC
  Administered 2019-09-11 (×2): 50 ug/kg/min via INTRAVENOUS
  Administered 2019-09-11 (×2): 40 ug/kg/min via INTRAVENOUS
  Administered 2019-09-11: 60 ug/kg/min via INTRAVENOUS
  Administered 2019-09-12: 25 ug/kg/min via INTRAVENOUS
  Administered 2019-09-12: 35 ug/kg/min via INTRAVENOUS
  Administered 2019-09-12: 40 ug/kg/min via INTRAVENOUS
  Administered 2019-09-13: 15 ug/kg/min via INTRAVENOUS
  Filled 2019-09-11 (×2): qty 100
  Filled 2019-09-11: qty 200
  Filled 2019-09-11 (×2): qty 100
  Filled 2019-09-11: qty 200
  Filled 2019-09-11: qty 100

## 2019-09-11 MED ORDER — METHOCARBAMOL 500 MG PO TABS
1000.0000 mg | ORAL_TABLET | Freq: Three times a day (TID) | ORAL | Status: DC
  Administered 2019-09-11 – 2019-09-16 (×17): 1000 mg
  Filled 2019-09-11 (×15): qty 2

## 2019-09-11 MED ORDER — CHLORHEXIDINE GLUCONATE CLOTH 2 % EX PADS
6.0000 | MEDICATED_PAD | Freq: Every day | CUTANEOUS | Status: DC
  Administered 2019-09-11 – 2019-09-16 (×6): 6 via TOPICAL

## 2019-09-11 NOTE — Progress Notes (Signed)
OT Cancellation Note  Patient Details Name: Justin Morton MRN: [id number] DOB: 25-Apr-1991   Cancelled Treatment:    Reason Eval/Treat Not Completed: Patient not medically ready.  OT to continue to follow for OT eval.   Flora Lipps, OTR/L Acute Rehabilitation Services Pager: [phone] Office: [phone]   Justin Morton C 09/11/2019, 9:37 AM

## 2019-09-11 NOTE — Progress Notes (Signed)
PT Cancellation Note  Patient Details Name: Justin Morton MRN: [id number] DOB: May 16, 1990   Cancelled Treatment:    Reason Eval/Treat Not Completed: Patient not medically ready. Pt intubated and sedated after OR.   Angelina Ok [facility] 09/11/2019, 9:32 AM Skip Mayer PT Acute Rehabilitation Services Pager [phone] Office [phone]

## 2019-09-11 NOTE — Progress Notes (Signed)
-  Trauma/Critical Care Follow Up Note  Subjective:    Overnight Issues:   Objective:  Vital signs for last 24 hours: Temp:  [93 F (33.9 C)-98.6 F (37 C)] 97.7 F (36.5 C) (05/05 0910) Pulse Rate:  [35-123] 66 (05/05 0900) Resp:  [0-33] 18 (05/05 0910) BP: (59-141)/(35-99) 141/83 (05/05 0809) SpO2:  [88 %-100 %] 97 % (05/05 0900) Arterial Line BP: (104-191)/(74-111) 132/77 (05/05 0910) FiO2 (%):  [80 %-100 %] 80 % (05/05 0809) Weight:  [68 kg] 68 kg (05/04 2028)  Hemodynamic parameters for last 24 hours: CVP:  [13 mmHg-15 mmHg] 13 mmHg  Intake/Output from previous day: 05/04 0701 - 05/05 0700 In: 26119.8 [I.V.:8700.2; Blood:17319.5; IV Piggyback:100.1] Out: 57017 [Urine:1225; Emesis/NG output:250; Blood:15000; Chest Tube:1170]  Intake/Output this shift: Total I/O In: 227.8 [I.V.:208.9; IV Piggyback:18.9] Out: 125 [Urine:75; Chest Tube:50]  Vent settings for last 24 hours: Vent Mode: PRVC FiO2 (%):  [80 %-100 %] 80 % Set Rate:  [18 bmp-30 bmp] 18 bmp Vt Set:  [420 mL-560 mL] 420 mL PEEP:  [5 cmH20-10 cmH20] 5 cmH20 Plateau Pressure:  [22 cmH20-42 cmH20] 23 cmH20  Physical Exam:  Gen: comfortable, no distress Neuro: sedated, does not follow commands HEENT: intubated Neck: supple CV: RRR, R chest with CT x2, 1.1L SS o/p, incision dressed with moderate staining, but appears dried Pulm: unlabored breathing, mechanically ventilated Abd: soft, nontender GU: clear, yellow urine Extr: wwp, no edema of extremities, but 1+ facial edema   Results for orders placed or performed during the hospital encounter of 09/10/19 (from the past 24 hour(s))  Type and screen Ordered by PROVIDER DEFAULT     Status: None (Preliminary result)   Collection Time: 09/10/19  8:12 PM  Result Value Ref Range   ABO/RH(D) O POS    Antibody Screen NEG    Sample Expiration      09/13/2019,2359 Performed at [facility], [address], [place], [place] [address]    Unit Number  [id number]    Blood Component Type RED CELLS,LR    Unit division 00    Status of Unit ISSUED    Unit tag comment EMERGENCY RELEASE    Transfusion Status OK TO TRANSFUSE    Crossmatch Result COMPATIBLE    Unit Number [id number]    Blood Component Type RED CELLS,LR    Unit division 00    Status of Unit ISSUED    Unit tag comment EMERGENCY RELEASE    Transfusion Status OK TO TRANSFUSE    Crossmatch Result COMPATIBLE    Unit Number [id number]    Blood Component Type RED CELLS,LR    Unit division 00    Status of Unit ISSUED    Unit tag comment EMERGENCY RELEASE    Transfusion Status OK TO TRANSFUSE    Crossmatch Result COMPATIBLE    Unit Number [id number]    Blood Component Type RED CELLS,LR    Unit division 00    Status of Unit ISSUED    Unit tag comment EMERGENCY RELEASE    Transfusion Status OK TO TRANSFUSE    Crossmatch Result COMPATIBLE    Unit Number [id number]    Blood Component Type RED CELLS,LR    Unit division 00    Status of Unit ISSUED    Transfusion Status OK TO TRANSFUSE    Crossmatch Result Compatible    Unit Number [id number]    Blood Component Type RED CELLS,LR    Unit division 00    Status of Unit ISSUED  Transfusion Status OK TO TRANSFUSE    Crossmatch Result Compatible    Unit Number [id number]    Blood Component Type RED CELLS,LR    Unit division 00    Status of Unit ISSUED    Transfusion Status OK TO TRANSFUSE    Crossmatch Result Compatible    Unit Number [id number]    Blood Component Type RED CELLS,LR    Unit division 00    Status of Unit ISSUED    Transfusion Status OK TO TRANSFUSE    Crossmatch Result Compatible    Unit Number [id number]    Blood Component Type RED CELLS,LR    Unit division 00    Status of Unit ISSUED    Transfusion Status OK TO TRANSFUSE    Crossmatch Result Compatible    Unit Number [id number]    Blood Component Type RED CELLS,LR    Unit division 00    Status of Unit ISSUED     Transfusion Status OK TO TRANSFUSE    Crossmatch Result Compatible    Unit Number [id number]    Blood Component Type RED CELLS,LR    Unit division 00    Status of Unit ISSUED    Transfusion Status OK TO TRANSFUSE    Crossmatch Result Compatible    Unit Number [id number]    Blood Component Type RED CELLS,LR    Unit division 00    Status of Unit ISSUED    Transfusion Status OK TO TRANSFUSE    Crossmatch Result Compatible    Unit Number [id number]    Blood Component Type RED CELLS,LR    Unit division 00    Status of Unit ISSUED    Transfusion Status OK TO TRANSFUSE    Crossmatch Result Compatible    Unit Number [id number]    Blood Component Type RED CELLS,LR    Unit division 00    Status of Unit ISSUED    Transfusion Status OK TO TRANSFUSE    Crossmatch Result Compatible    Unit Number [id number]    Blood Component Type RED CELLS,LR    Unit division 00    Status of Unit ISSUED    Transfusion Status OK TO TRANSFUSE    Crossmatch Result Compatible    Unit Number [id number]    Blood Component Type RED CELLS,LR    Unit division 00    Status of Unit ISSUED    Transfusion Status OK TO TRANSFUSE    Crossmatch Result Compatible    Unit Number [id number]    Blood Component Type RBC LR PHER2    Unit division 00    Status of Unit ISSUED    Transfusion Status OK TO TRANSFUSE    Crossmatch Result Compatible    Unit Number [id number]    Blood Component Type RED CELLS,LR    Unit division 00    Status of Unit ISSUED    Transfusion Status OK TO TRANSFUSE    Crossmatch Result Compatible    Unit Number [id number]    Blood Component Type RED CELLS,LR    Unit division 00    Status of Unit ISSUED    Transfusion Status OK TO TRANSFUSE    Crossmatch Result Compatible    Unit Number [id number]    Blood Component Type RED CELLS,LR    Unit division 00    Status of Unit ISSUED    Transfusion Status OK TO TRANSFUSE    Crossmatch Result Compatible    Unit  Number [id number]    Blood Component Type  RBC LR PHER2    Unit division 00    Status of Unit ISSUED    Transfusion Status OK TO TRANSFUSE    Crossmatch Result Compatible    Unit Number [id number]    Blood Component Type RED CELLS,LR    Unit division 00    Status of Unit ISSUED    Transfusion Status OK TO TRANSFUSE    Crossmatch Result Compatible    Unit Number [id number]    Blood Component Type RED CELLS,LR    Unit division 00    Status of Unit ISSUED    Transfusion Status OK TO TRANSFUSE    Crossmatch Result Compatible    Unit Number [id number]    Blood Component Type RBC LR PHER1    Unit division 00    Status of Unit ISSUED    Transfusion Status OK TO TRANSFUSE    Crossmatch Result Compatible    Unit Number [id number]    Blood Component Type RBC LR PHER2    Unit division 00    Status of Unit ISSUED    Transfusion Status OK TO TRANSFUSE    Crossmatch Result Compatible    Unit Number [id number]    Blood Component Type RBC LR PHER2    Unit division 00    Status of Unit ISSUED    Transfusion Status OK TO TRANSFUSE    Crossmatch Result Compatible    Unit Number [id number]    Blood Component Type RED CELLS,LR    Unit division 00    Status of Unit ISSUED    Transfusion Status OK TO TRANSFUSE    Crossmatch Result Compatible    Unit Number [id number]    Blood Component Type RED CELLS,LR    Unit division 00    Status of Unit ISSUED    Transfusion Status OK TO TRANSFUSE    Crossmatch Result Compatible    Unit Number [id number]    Blood Component Type RED CELLS,LR    Unit division 00    Status of Unit REL FROM [facility]    Transfusion Status OK TO TRANSFUSE    Crossmatch Result Compatible    Unit Number [id number]    Blood Component Type RED CELLS,LR    Unit division 00    Status of Unit REL FROM [facility]    Transfusion Status OK TO TRANSFUSE    Crossmatch Result Compatible    Unit Number [id number]    Blood Component Type RED CELLS,LR     Unit division 00    Status of Unit REL FROM [facility]    Transfusion Status OK TO TRANSFUSE    Crossmatch Result Compatible    Unit Number [id number]    Blood Component Type RED CELLS,LR    Unit division 00    Status of Unit REL FROM [facility]    Transfusion Status OK TO TRANSFUSE    Crossmatch Result Compatible    Unit Number [id number]    Blood Component Type RED CELLS,LR    Unit division 00    Status of Unit REL FROM [facility]    Transfusion Status OK TO TRANSFUSE    Crossmatch Result Compatible    Unit Number [id number]    Blood Component Type RED CELLS,LR    Unit division 00    Status of Unit REL FROM [facility]    Transfusion Status OK TO TRANSFUSE    Crossmatch Result Compatible    Unit Number [id number]    Blood Component Type RED CELLS,LR    Unit division 00  [facility]    Transfusion Status OK TO TRANSFUSE    Crossmatch Result Compatible    Unit Number [id number]    Blood Component Type RED CELLS,LR    Unit division 00    [facility]    Transfusion Status OK TO TRANSFUSE    Crossmatch Result Compatible    Unit Number [id number]    Blood Component Type RED CELLS,LR    Unit division 00    [facility]    Transfusion Status OK TO TRANSFUSE    Crossmatch Result Compatible    Unit Number [id number]    Blood Component Type RBC LR PHER2    Unit division 00    [facility]    Transfusion Status OK TO TRANSFUSE    Crossmatch Result Compatible    Unit Number [id number]    Blood Component Type RED CELLS,LR    Unit division 00    Status of Unit ALLOCATED    Transfusion Status OK TO TRANSFUSE    Crossmatch Result Compatible    Unit Number [id number]    Blood Component Type RED CELLS,LR    Unit division 00    Status of Unit ALLOCATED    Transfusion Status OK TO TRANSFUSE    Crossmatch Result Compatible    Unit Number [id number]    Blood Component Type RED CELLS,LR    Unit  division 00    Status of Unit ALLOCATED    Transfusion Status OK TO TRANSFUSE    Crossmatch Result Compatible    Unit Number [id number]    Blood Component Type RED CELLS,LR    Unit division 00    Status of Unit ALLOCATED    Transfusion Status OK TO TRANSFUSE    Crossmatch Result Compatible    Unit Number [id number]    Blood Component Type RED CELLS,LR    Unit division 00    [facility]    Transfusion Status OK TO TRANSFUSE    Crossmatch Result Compatible    Unit Number [id number]    Blood Component Type RED CELLS,LR    Unit division 00    [facility]    Transfusion Status OK TO TRANSFUSE    Crossmatch Result Compatible    Unit Number [id number]    Blood Component Type RED CELLS,LR    Unit division 00    [facility]    Transfusion Status OK TO TRANSFUSE    Crossmatch Result Compatible    Unit Number [id number]    Blood Component Type RED CELLS,LR    Unit division 00    [facility]    Transfusion Status OK TO TRANSFUSE    Crossmatch Result Compatible    Unit Number [id number]    Blood Component Type RED CELLS,LR    Unit division 00    Status of Unit ISSUED    Transfusion Status OK TO TRANSFUSE    Crossmatch Result COMPATIBLE    Unit Number [id number]    Blood Component Type RED CELLS,LR    Unit division 00    Status of Unit ISSUED    Transfusion Status OK TO TRANSFUSE    Crossmatch Result COMPATIBLE    Unit Number [id number]    Blood Component Type RED CELLS,LR    Unit division 00    Status of Unit ISSUED    Transfusion  Status OK TO TRANSFUSE    Crossmatch Result COMPATIBLE   ABO/Rh     Status: None   Collection Time: 09/10/19  8:12 PM  Result Value Ref Range   ABO/RH(D)      O POS Performed at [facility], [address] [place], [place] [address]   I-Stat Chem 8, ED     Status: Abnormal   Collection Time: 09/10/19  8:19 PM  Result Value Ref Range    Sodium 139 135 - 145 mmol/L   Potassium 3.7 3.5 - 5.1 mmol/L   Chloride 109 98 - 111 mmol/L   BUN 11 8 - 23 mg/dL   Creatinine, Ser 6.04 (H) 0.61 - 1.24 mg/dL   Glucose, Bld 540 (H) 70 - 99 mg/dL   Calcium, Ion 9.81 (L) 1.15 - 1.40 mmol/L   TCO2 15 (L) 22 - 32 mmol/L   Hemoglobin 10.2 (L) 13.0 - 17.0 g/dL   HCT 19.1 (L) 47.8 - 29.5 %  I-STAT, chem 8     Status: Abnormal   Collection Time: 09/10/19  8:19 PM  Result Value Ref Range   Sodium 139 135 - 145 mmol/L   Potassium 3.7 3.5 - 5.1 mmol/L   Chloride 109 98 - 111 mmol/L   BUN 11 6 - 20 mg/dL   Creatinine, Ser 6.21 (H) 0.61 - 1.24 mg/dL   Glucose, Bld 308 (H) 70 - 99 mg/dL   Calcium, Ion 6.57 (L) 1.15 - 1.40 mmol/L   TCO2 15 (L) 22 - 32 mmol/L   Hemoglobin 10.2 (L) 13.0 - 17.0 g/dL   HCT 84.6 (L) 96.2 - 95.2 %  Prepare fresh frozen plasma     Status: None (Preliminary result)   Collection Time: 09/10/19  8:24 PM  Result Value Ref Range   Unit Number [id number]    Blood Component Type THAWED PLASMA    Unit division 00    Status of Unit ISSUED    Unit tag comment EMERGENCY RELEASE    Transfusion Status OK TO TRANSFUSE    Unit Number [id number]    Blood Component Type THAWED PLASMA    Unit division 00    Status of Unit ISSUED    Unit tag comment EMERGENCY RELEASE    Transfusion Status OK TO TRANSFUSE    Unit Number [id number]    Blood Component Type THW PLS APHR    Unit division 00    Status of Unit ISSUED    Transfusion Status OK TO TRANSFUSE    Unit Number [id number]    Blood Component Type THW PLS APHR    Unit division A0    Status of Unit ISSUED    Transfusion Status OK TO TRANSFUSE    Unit Number [id number]    Blood Component Type THAWED PLASMA    Unit division 00    Status of Unit ISSUED    Unit tag comment VERBAL ORDERS PER DR LITTLE    Transfusion Status OK TO TRANSFUSE    Unit Number [id number]    Blood Component Type THAWED PLASMA    Unit division 00    Status of Unit ISSUED    Unit  tag comment VERBAL ORDERS PER DR LITTLE    Transfusion Status OK TO TRANSFUSE    Unit Number [id number]    Blood Component Type THW PLS APHR    Unit division A0    Status of Unit ISSUED    Unit tag comment VERBAL ORDERS PER DR LITTLE  Transfusion Status OK TO TRANSFUSE    Unit Number [id number]    Blood Component Type THW PLS APHR    Unit division 00    Status of Unit ISSUED    Unit tag comment VERBAL ORDERS PER DR LITTLE    Transfusion Status OK TO TRANSFUSE    Unit Number [id number]    Blood Component Type THAWED PLASMA    Unit division 00    Status of Unit ISSUED    Transfusion Status OK TO TRANSFUSE    Unit Number [id number]    Blood Component Type THAWED PLASMA    Unit division 00    Status of Unit ISSUED    Transfusion Status OK TO TRANSFUSE    Unit Number [id number]    Blood Component Type THAWED PLASMA    Unit division 00    Status of Unit ISSUED    Transfusion Status OK TO TRANSFUSE    Unit Number [id number]    Blood Component Type THW PLS APHR    Unit division B0    Status of Unit ISSUED    Transfusion Status OK TO TRANSFUSE    Unit Number [id number]    Blood Component Type THAWED PLASMA    Unit division 00    Status of Unit ISSUED    Transfusion Status OK TO TRANSFUSE    Unit Number [id number]    Blood Component Type THW PLS APHR    Unit division B0    Status of Unit ISSUED    Transfusion Status OK TO TRANSFUSE    Unit Number [id number]    Blood Component Type THAWED PLASMA    Unit division 00    Status of Unit ISSUED    Transfusion Status OK TO TRANSFUSE    Unit Number [id number]    Blood Component Type THW PLS APHR    Unit division B0    Status of Unit ISSUED    Transfusion Status OK TO TRANSFUSE    Unit Number [id number]    Blood Component Type THAWED PLASMA    Unit division 00    Status of Unit ISSUED    Transfusion Status OK TO TRANSFUSE    Unit Number [id number]    Blood Component Type THW PLS APHR     Unit division A0    Status of Unit ISSUED    Transfusion Status OK TO TRANSFUSE    Unit Number [id number]    Blood Component Type THW PLS APHR    Unit division B0    Status of Unit ISSUED    Transfusion Status OK TO TRANSFUSE    Unit Number [id number]    Blood Component Type THW PLS APHR    Unit division C0    Status of Unit ISSUED    Transfusion Status OK TO TRANSFUSE    Unit Number [id number]    Blood Component Type THW PLS APHR    Unit division A0    Status of Unit REL FROM [facility]    Transfusion Status OK TO TRANSFUSE    Unit Number [id number]    Blood Component Type THW PLS APHR    Unit division B0    Status of Unit REL FROM [facility]    Transfusion Status OK TO TRANSFUSE    Unit Number [id number]    Blood Component Type THW PLS APHR    Unit division B0    Status of Unit REL FROM [facility]    Transfusion Status OK TO TRANSFUSE    Unit Number [id number]  Blood Component Type THAWED PLASMA    Unit division 00    Status of Unit REL FROM [facility]    Transfusion Status OK TO TRANSFUSE    Unit Number [id number]    Blood Component Type THAWED PLASMA    Unit division 00    Status of Unit ISSUED    Transfusion Status OK TO TRANSFUSE    Unit Number [id number]    Blood Component Type THW PLS APHR    Unit division A0    Status of Unit ISSUED    Transfusion Status OK TO TRANSFUSE   Respiratory Panel by RT PCR (Flu A&B, Covid) - Nasopharyngeal Swab     Status: None   Collection Time: 09/10/19  8:30 PM   Specimen: Nasopharyngeal Swab  Result Value Ref Range   SARS Coronavirus 2 by RT PCR NEGATIVE NEGATIVE   Influenza A by PCR NEGATIVE NEGATIVE   Influenza B by PCR NEGATIVE NEGATIVE  Prepare cryoprecipitate     Status: None (Preliminary result)   Collection Time: 09/10/19  8:56 PM  Result Value Ref Range   Unit Number [id number]    Blood Component Type CRYPOOL THAW    Unit division 00    Status of Unit ISSUED    Transfusion Status OK TO TRANSFUSE    Unit  Number [id number]    Blood Component Type CRYPOOL THAW    Unit division 00    Status of Unit ISSUED    Transfusion Status OK TO TRANSFUSE    Unit Number [id number]    Blood Component Type CRYPOOL THAW    Unit division 00    Status of Unit ISSUED    Transfusion Status OK TO TRANSFUSE    Unit Number [id number]    Blood Component Type CRYPOOL THAW    Unit division 00    Status of Unit ISSUED    Transfusion Status OK TO TRANSFUSE    Unit Number [id number]    Blood Component Type CRYPOOL THAW    Unit division 00    Status of Unit ISSUED    Transfusion Status OK TO TRANSFUSE   Prepare platelet pheresis     Status: None (Preliminary result)   Collection Time: 09/10/19  8:57 PM  Result Value Ref Range   Unit Number [id number]    Blood Component Type PLTP LR3 PAS    Unit division 00    Status of Unit ISSUED    Transfusion Status OK TO TRANSFUSE    Unit Number [id number]    Blood Component Type PLTP LR2 PAS    Unit division 00    Status of Unit ISSUED    Transfusion Status OK TO TRANSFUSE    Unit Number [id number]    Blood Component Type PLTP LR1 PAS    Unit division 00    Status of Unit ISSUED    Transfusion Status OK TO TRANSFUSE    Unit Number [id number]    Blood Component Type PLTP LR1 PAS    Unit division 00    Status of Unit ISSUED    Transfusion Status OK TO TRANSFUSE    Unit Number [id number]    Blood Component Type PLTPH LI3 PAS    Unit division 00    Status of Unit ISSUED    Transfusion Status OK TO TRANSFUSE   I-STAT 7, (LYTES, BLD GAS, ICA, H+H)     Status: Abnormal   Collection Time: 09/10/19  9:34 PM  Result Value Ref Range   pH, Arterial 6.926 (LL)  7.350 - 7.450   pCO2 arterial 70.2 (HH) 32.0 - 48.0 mmHg   pO2, Arterial 65 (L) 83.0 - 108.0 mmHg   Bicarbonate 14.6 (L) 20.0 - 28.0 mmol/L   TCO2 17 (L) 22 - 32 mmol/L   O2 Saturation 75.0 %   Acid-base deficit 18.0 (H) 0.0 - 2.0 mmol/L   Sodium 141 135 - 145 mmol/L   Potassium 6.9  (HH) 3.5 - 5.1 mmol/L   Calcium, Ion <0.30 (LL) 1.15 - 1.40 mmol/L   HCT 36.0 (L) 39.0 - 52.0 %   Hemoglobin 12.2 (L) 13.0 - 17.0 g/dL   Sample type ARTERIAL   I-STAT 7, (LYTES, BLD GAS, ICA, H+H)     Status: Abnormal   Collection Time: 09/10/19 10:00 PM  Result Value Ref Range   pH, Arterial 6.967 (LL) 7.350 - 7.450   pCO2 arterial 85.1 (HH) 32.0 - 48.0 mmHg   pO2, Arterial 41 (L) 83.0 - 108.0 mmHg   Bicarbonate 20.3 20.0 - 28.0 mmol/L   TCO2 23 22 - 32 mmol/L   O2 Saturation 56.0 %   Acid-base deficit 13.0 (H) 0.0 - 2.0 mmol/L   Sodium 146 (H) 135 - 145 mmol/L   Potassium 6.2 (H) 3.5 - 5.1 mmol/L   Calcium, Ion 0.51 (LL) 1.15 - 1.40 mmol/L   HCT 34.0 (L) 39.0 - 52.0 %   Hemoglobin 11.6 (L) 13.0 - 17.0 g/dL   Patient temperature 38.4 C    Sample type ARTERIAL   I-STAT 7, (LYTES, BLD GAS, ICA, H+H)     Status: Abnormal   Collection Time: 09/10/19 10:33 PM  Result Value Ref Range   pH, Arterial 7.108 (LL) 7.350 - 7.450   pCO2 arterial 73.9 (HH) 32.0 - 48.0 mmHg   pO2, Arterial 55 (L) 83.0 - 108.0 mmHg   Bicarbonate 24.4 20.0 - 28.0 mmol/L   TCO2 27 22 - 32 mmol/L   O2 Saturation 83.0 %   Acid-base deficit 7.0 (H) 0.0 - 2.0 mmol/L   Sodium 149 (H) 135 - 145 mmol/L   Potassium 4.3 3.5 - 5.1 mmol/L   Calcium, Ion 0.84 (LL) 1.15 - 1.40 mmol/L   HCT 34.0 (L) 39.0 - 52.0 %   Hemoglobin 11.6 (L) 13.0 - 17.0 g/dL   Patient temperature 66.5 C    Sample type ARTERIAL   I-STAT 7, (LYTES, BLD GAS, ICA, H+H)     Status: Abnormal   Collection Time: 09/10/19 11:17 PM  Result Value Ref Range   pH, Arterial 7.017 (LL) 7.350 - 7.450   pCO2 arterial 81.2 (HH) 32.0 - 48.0 mmHg   pO2, Arterial 56 (L) 83.0 - 108.0 mmHg   Bicarbonate 22.0 20.0 - 28.0 mmol/L   TCO2 25 22 - 32 mmol/L   O2 Saturation 80.0 %   Acid-base deficit 10.0 (H) 0.0 - 2.0 mmol/L   Sodium 148 (H) 135 - 145 mmol/L   Potassium 5.0 3.5 - 5.1 mmol/L   Calcium, Ion 0.76 (LL) 1.15 - 1.40 mmol/L   HCT 25.0 (L) 39.0 - 52.0 %    Hemoglobin 8.5 (L) 13.0 - 17.0 g/dL   Patient temperature 99.3 C    Sample type ARTERIAL   I-STAT 7, (LYTES, BLD GAS, ICA, H+H)     Status: Abnormal   Collection Time: 09/10/19 11:42 PM  Result Value Ref Range   pH, Arterial 7.060 (LL) 7.350 - 7.450   pCO2 arterial 76.5 (HH) 32.0 - 48.0 mmHg   pO2, Arterial 56 (L) 83.0 - 108.0  mmHg   Bicarbonate 22.9 20.0 - 28.0 mmol/L   TCO2 26 22 - 32 mmol/L   O2 Saturation 82.0 %   Acid-base deficit 10.0 (H) 0.0 - 2.0 mmol/L   Sodium 147 (H) 135 - 145 mmol/L   Potassium 6.0 (H) 3.5 - 5.1 mmol/L   Calcium, Ion 0.76 (LL) 1.15 - 1.40 mmol/L   HCT 36.0 (L) 39.0 - 52.0 %   Hemoglobin 12.2 (L) 13.0 - 17.0 g/dL   Patient temperature 33.5 C    Sample type ARTERIAL   CBC     Status: Abnormal   Collection Time: 09/11/19 12:48 AM  Result Value Ref Range   WBC 8.2 4.0 - 10.5 K/uL   RBC 4.26 4.22 - 5.81 MIL/uL   Hemoglobin 12.3 (L) 13.0 - 17.0 g/dL   HCT 36.8 (L) 39.0 - 52.0 %   MCV 86.4 80.0 - 100.0 fL   MCH 28.9 26.0 - 34.0 pg   MCHC 33.4 30.0 - 36.0 g/dL   RDW 14.8 11.5 - 15.5 %   Platelets 94 (L) 150 - 400 K/uL   nRBC 0.0 0.0 - 0.2 %  Comprehensive metabolic panel     Status: Abnormal   Collection Time: 09/11/19 12:48 AM  Result Value Ref Range   Sodium 150 (H) 135 - 145 mmol/L   Potassium 5.7 (H) 3.5 - 5.1 mmol/L   Chloride 113 (H) 98 - 111 mmol/L   CO2 27 22 - 32 mmol/L   Glucose, Bld 172 (H) 70 - 99 mg/dL   BUN 9 6 - 20 mg/dL   Creatinine, Ser 0.90 0.61 - 1.24 mg/dL   Calcium 10.8 (H) 8.9 - 10.3 mg/dL   Total Protein 4.3 (L) 6.5 - 8.1 g/dL   Albumin 2.4 (L) 3.5 - 5.0 g/dL   AST 101 (H) 15 - 41 U/L   ALT 64 (H) 0 - 44 U/L   Alkaline Phosphatase 42 38 - 126 U/L   Total Bilirubin 0.9 0.3 - 1.2 mg/dL   GFR calc non Af Amer >60 >60 mL/min   GFR calc Af Amer >60 >60 mL/min   Anion gap 10 5 - 15  Triglycerides     Status: None   Collection Time: 09/11/19 12:48 AM  Result Value Ref Range   Triglycerides 100 <150 mg/dL  MRSA PCR  Screening     Status: None   Collection Time: 09/11/19  1:00 AM   Specimen: Nasopharyngeal  Result Value Ref Range   MRSA by PCR NEGATIVE NEGATIVE  I-STAT 7, (LYTES, BLD GAS, ICA, H+H)     Status: Abnormal   Collection Time: 09/11/19  1:06 AM  Result Value Ref Range   pH, Arterial 7.336 (L) 7.350 - 7.450   pCO2 arterial 48.5 (H) 32.0 - 48.0 mmHg   pO2, Arterial 49 (L) 83.0 - 108.0 mmHg   Bicarbonate 26.8 20.0 - 28.0 mmol/L   TCO2 28 22 - 32 mmol/L   O2 Saturation 87.0 %   Acid-Base Excess 0.0 0.0 - 2.0 mmol/L   Sodium 150 (H) 135 - 145 mmol/L   Potassium 5.1 3.5 - 5.1 mmol/L   Calcium, Ion 1.40 1.15 - 1.40 mmol/L   HCT 33.0 (L) 39.0 - 52.0 %   Hemoglobin 11.2 (L) 13.0 - 17.0 g/dL   Patient temperature 34.0 C    Sample type ARTERIAL   Glucose, capillary     Status: Abnormal   Collection Time: 09/11/19  1:07 AM  Result Value Ref Range   Glucose-Capillary  154 (H) 70 - 99 mg/dL  DIC (disseminated intravasc coag) panel     Status: Abnormal   Collection Time: 09/11/19  1:09 AM  Result Value Ref Range   Prothrombin Time 11.4 11.4 - 15.2 seconds   INR 0.9 0.8 - 1.2   aPTT 47 (H) 24 - 36 seconds   Fibrinogen 305 210 - 475 mg/dL   D-Dimer, Quant >16.10 (H) 0.00 - 0.50 ug/mL-FEU   Platelets 96 (L) 150 - 400 K/uL   Smear Review NO SCHISTOCYTES

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
## 2019-09-11 NOTE — Progress Notes (Signed)
Airway pressures on 8cc Vt (560) were high: PIP 48, plateau 42.  Dropped Vt to 6cc (420) with some improvement: PIP 40, plat 35.  ABG on 420/30/100%/10+ 7.34/48.5/49/26.8.  Dr. Fredricka Bonine aware.
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
## 2019-09-12 ENCOUNTER — Inpatient Hospital Stay (HOSPITAL_COMMUNITY): Payer: Self-pay

## 2019-09-12 LAB — BPAM FFP
Blood Product Expiration Date: 202105042359
Blood Product Expiration Date: 202105042359
Blood Product Expiration Date: 202105092359
Blood Product Expiration Date: 202105092359
Blood Product Expiration Date: 202105092359
Blood Product Expiration Date: 202105092359
Blood Product Expiration Date: 202105092359
Blood Product Expiration Date: 202105092359
Blood Product Expiration Date: 202105092359
Blood Product Expiration Date: 202105092359
Blood Product Expiration Date: 202105092359
Blood Product Expiration Date: 202105092359
Blood Product Expiration Date: 202105092359
Blood Product Expiration Date: 202105092359
Blood Product Expiration Date: 202105092359
Blood Product Expiration Date: 202105092359
Blood Product Expiration Date: 202105092359
Blood Product Expiration Date: 202105092359
Blood Product Expiration Date: 202105092359
Blood Product Expiration Date: 202105092359
Blood Product Expiration Date: 202105092359
Blood Product Expiration Date: 202105092359
Blood Product Expiration Date: 202105092359
Blood Product Expiration Date: 202105092359
Blood Product Expiration Date: 202105102359
Blood Product Expiration Date: 202105102359
Blood Product Expiration Date: 202105092359
Blood Product Expiration Date: 202105092359
ISSUE DATE / TIME: 202105042026
ISSUE DATE / TIME: 202105042026
ISSUE DATE / TIME: 202105042052
ISSUE DATE / TIME: 202105042052
ISSUE DATE / TIME: 202105042107
ISSUE DATE / TIME: 202105042107
ISSUE DATE / TIME: 202105042107
ISSUE DATE / TIME: 202105042107
ISSUE DATE / TIME: 202105042146
ISSUE DATE / TIME: 202105042146
ISSUE DATE / TIME: 202105042146
ISSUE DATE / TIME: 202105042146
ISSUE DATE / TIME: 202105042231
ISSUE DATE / TIME: 202105042231
ISSUE DATE / TIME: 202105042231
ISSUE DATE / TIME: 202105042231
ISSUE DATE / TIME: 202105042303
ISSUE DATE / TIME: 202105042303
ISSUE DATE / TIME: 202105042303
ISSUE DATE / TIME: 202105042303
ISSUE DATE / TIME: 202105042356
ISSUE DATE / TIME: 202105042356
ISSUE DATE / TIME: 202105042356
ISSUE DATE / TIME: 202105042356
ISSUE DATE / TIME: 202105050231
ISSUE DATE / TIME: 202105050231
ISSUE DATE / TIME: 202105060813
ISSUE DATE / TIME: 202105060813
Unit Type and Rh: 5100
Unit Type and Rh: 5100
Unit Type and Rh: 5100
Unit Type and Rh: 5100
Unit Type and Rh: 5100
Unit Type and Rh: 5100
Unit Type and Rh: 5100
Unit Type and Rh: 5100
Unit Type and Rh: 5100
Unit Type and Rh: 5100
Unit Type and Rh: 5100
Unit Type and Rh: 5100
Unit Type and Rh: 5100
Unit Type and Rh: 5100
Unit Type and Rh: 600
Unit Type and Rh: 6200
Unit Type and Rh: 6200
Unit Type and Rh: 6200
Unit Type and Rh: 6200
Unit Type and Rh: 6200
Unit Type and Rh: 6200
Unit Type and Rh: 6200
Unit Type and Rh: 6200
Unit Type and Rh: 9500
Unit Type and Rh: 9500
Unit Type and Rh: 9500
Unit Type and Rh: 5100
Unit Type and Rh: 5100

## 2019-09-12 LAB — BPAM CRYOPRECIPITATE
Blood Product Expiration Date: 202105050313
Blood Product Expiration Date: 202105050402
Blood Product Expiration Date: 202105050505
Blood Product Expiration Date: 202105050517
Blood Product Expiration Date: 202105050624
ISSUE DATE / TIME: 202105042116
ISSUE DATE / TIME: 202105042229
ISSUE DATE / TIME: 202105042326
ISSUE DATE / TIME: 202105042353
ISSUE DATE / TIME: 202105050111
Unit Type and Rh: 5100
Unit Type and Rh: 5100
Unit Type and Rh: 5100
Unit Type and Rh: 5100
Unit Type and Rh: 5100

## 2019-09-12 LAB — PREPARE FRESH FROZEN PLASMA
Unit division: 0
Unit division: 0
Unit division: 0
Unit division: 0
Unit division: 0
Unit division: 0
Unit division: 0
Unit division: 0
Unit division: 0
Unit division: 0
Unit division: 0
Unit division: 0
Unit division: 0
Unit division: 0
Unit division: 0

## 2019-09-12 LAB — CBC
HCT: 25 % — ABNORMAL LOW (ref 39.0–52.0)
Hemoglobin: 8.4 g/dL — ABNORMAL LOW (ref 13.0–17.0)
MCH: 28.6 pg (ref 26.0–34.0)
MCHC: 33.6 g/dL (ref 30.0–36.0)
MCV: 85 fL (ref 80.0–100.0)
Platelets: 122 10*3/uL — ABNORMAL LOW (ref 150–400)
RBC: 2.94 MIL/uL — ABNORMAL LOW (ref 4.22–5.81)
RDW: 15.5 % (ref 11.5–15.5)
WBC: 11.1 10*3/uL — ABNORMAL HIGH (ref 4.0–10.5)
nRBC: 0 % (ref 0.0–0.2)

## 2019-09-12 LAB — BPAM PLATELET PHERESIS
Blood Product Expiration Date: 202105052316
Blood Product Expiration Date: 202105052359
Blood Product Expiration Date: 202105052359
Blood Product Expiration Date: 202105052359
Blood Product Expiration Date: 202105062359
Blood Product Expiration Date: 202105062359
Blood Product Expiration Date: 202105062359
ISSUE DATE / TIME: 202105042103
ISSUE DATE / TIME: 202105042140
ISSUE DATE / TIME: 202105042201
ISSUE DATE / TIME: 202105042318
ISSUE DATE / TIME: 202105042336
ISSUE DATE / TIME: 202105050237
ISSUE DATE / TIME: 202105050824
Unit Type and Rh: 600
Unit Type and Rh: 600
Unit Type and Rh: 600
Unit Type and Rh: 600
Unit Type and Rh: 6200
Unit Type and Rh: 7300
Unit Type and Rh: 7300

## 2019-09-12 LAB — PREPARE PLATELET PHERESIS
Unit division: 0
Unit division: 0
Unit division: 0
Unit division: 0
Unit division: 0
Unit division: 0
Unit division: 0

## 2019-09-12 LAB — BASIC METABOLIC PANEL
Anion gap: 3 — ABNORMAL LOW (ref 5–15)
BUN: 16 mg/dL (ref 6–20)
CO2: 31 mmol/L (ref 22–32)
Calcium: 6.7 mg/dL — ABNORMAL LOW (ref 8.9–10.3)
Chloride: 111 mmol/L (ref 98–111)
Creatinine, Ser: 1.16 mg/dL (ref 0.61–1.24)
GFR calc Af Amer: >60 mL/min (ref >60)
GFR calc non Af Amer: >60 mL/min (ref >60)
Glucose, Bld: 128 mg/dL — ABNORMAL HIGH (ref 70–99)
Potassium: 3.3 mmol/L — ABNORMAL LOW (ref 3.5–5.1)
Sodium: 145 mmol/L (ref 135–145)

## 2019-09-12 LAB — PREPARE CRYOPRECIPITATE
Unit division: 0
Unit division: 0
Unit division: 0
Unit division: 0
Unit division: 0

## 2019-09-12 LAB — POCT I-STAT 7, (LYTES, BLD GAS, ICA,H+H)
Acid-Base Excess: 10 mmol/L — ABNORMAL HIGH (ref 0.0–2.0)
Bicarbonate: 34.8 mmol/L — ABNORMAL HIGH (ref 20.0–28.0)
Calcium, Ion: 1.04 mmol/L — ABNORMAL LOW (ref 1.15–1.40)
HCT: 22 % — ABNORMAL LOW (ref 39.0–52.0)
Hemoglobin: 7.5 g/dL — ABNORMAL LOW (ref 13.0–17.0)
O2 Saturation: 100 %
Patient temperature: 36.5
Potassium: 3.3 mmol/L — ABNORMAL LOW (ref 3.5–5.1)
Sodium: 146 mmol/L — ABNORMAL HIGH (ref 135–145)
TCO2: 36 mmol/L — ABNORMAL HIGH (ref 22–32)
pCO2 arterial: 46.9 mmHg (ref 32.0–48.0)
pH, Arterial: 7.477 — ABNORMAL HIGH (ref 7.350–7.450)
pO2, Arterial: 265 mmHg — ABNORMAL HIGH (ref 83.0–108.0)

## 2019-09-12 LAB — GLUCOSE, CAPILLARY
Glucose-Capillary: 118 mg/dL — ABNORMAL HIGH (ref 70–99)
Glucose-Capillary: 106 mg/dL — ABNORMAL HIGH (ref 70–99)
Glucose-Capillary: 125 mg/dL — ABNORMAL HIGH (ref 70–99)
Glucose-Capillary: 122 mg/dL — ABNORMAL HIGH (ref 70–99)
Glucose-Capillary: 114 mg/dL — ABNORMAL HIGH (ref 70–99)

## 2019-09-12 LAB — SURGICAL PATHOLOGY

## 2019-09-12 LAB — MAGNESIUM: Magnesium: 1.1 mg/dL — ABNORMAL LOW (ref 1.7–2.4)

## 2019-09-12 LAB — PHOSPHORUS: Phosphorus: 4.1 mg/dL (ref 2.5–4.6)

## 2019-09-12 MED ORDER — FUROSEMIDE 10 MG/ML IJ SOLN
40.0000 mg | Freq: Once | INTRAMUSCULAR | Status: AC
  Administered 2019-09-12: 40 mg via INTRAVENOUS
  Filled 2019-09-12: qty 4

## 2019-09-12 MED ORDER — QUETIAPINE FUMARATE 50 MG PO TABS
50.0000 mg | ORAL_TABLET | Freq: Two times a day (BID) | ORAL | Status: DC
  Administered 2019-09-12 – 2019-09-15 (×8): 50 mg
  Filled 2019-09-12 (×8): qty 1

## 2019-09-12 MED ORDER — CLONAZEPAM 0.5 MG PO TABS
0.5000 mg | ORAL_TABLET | Freq: Two times a day (BID) | ORAL | Status: DC
  Administered 2019-09-12 – 2019-09-15 (×7): 0.5 mg
  Filled 2019-09-12 (×7): qty 1

## 2019-09-12 MED ORDER — POTASSIUM CHLORIDE 10 MEQ/100ML IV SOLN
10.0000 meq | INTRAVENOUS | Status: AC
  Administered 2019-09-12 (×2): 10 meq via INTRAVENOUS

## 2019-09-12 MED ORDER — ACETAMINOPHEN 160 MG/5ML PO SOLN
1000.0000 mg | Freq: Four times a day (QID) | ORAL | Status: DC
  Administered 2019-09-12 – 2019-09-16 (×15): 1000 mg
  Filled 2019-09-12 (×15): qty 40.6

## 2019-09-12 MED ORDER — POTASSIUM CHLORIDE 10 MEQ/100ML IV SOLN
10.0000 meq | INTRAVENOUS | Status: AC
  Administered 2019-09-12 (×3): 10 meq via INTRAVENOUS
  Filled 2019-09-12 (×3): qty 100

## 2019-09-12 MED ORDER — MAGNESIUM SULFATE 2 GM/50ML IV SOLN
2.0000 g | Freq: Once | INTRAVENOUS | Status: AC
  Administered 2019-09-12: 2 g via INTRAVENOUS
  Filled 2019-09-12: qty 50

## 2019-09-12 MED ORDER — OXYCODONE HCL 5 MG/5ML PO SOLN
10.0000 mg | ORAL | Status: DC | PRN
  Administered 2019-09-15 – 2019-09-16 (×3): 10 mg
  Filled 2019-09-12 (×3): qty 10
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
## 2019-09-12 NOTE — Addendum Note (Signed)
Addendum  created 09/12/19 0736 by Adair Laundry, CRNA   Order list changed

## 2019-09-12 NOTE — Progress Notes (Signed)
PT Cancellation Note  Patient Details Name: Shelby Anderle MRN: [id number] DOB: 01-Aug-1990   Cancelled Treatment:    Reason Eval/Treat Not Completed: Patient not medically ready(ETT with FiO2 60%)   Margarit Minshall B Kymberlyn Eckford 09/12/2019, 8:32 AM  Merryl Hacker, PT Acute Rehabilitation Services Pager: [phone] Office: [phone]

## 2019-09-12 NOTE — Progress Notes (Signed)
OT Cancellation Note  Patient Details Name: Justin Morton MRN: [id number] DOB: 1990-07-25   Cancelled Treatment:    Reason Eval/Treat Not Completed: Patient not medically ready;Other (comment)(Pt remains ventilated with ETT with FiO2 60%)  Dalphine Handing, MSOT, OTR/L Acute Rehabilitation Services [facility] Office Number: [phone] Pager: [phone]  Dalphine Handing 09/12/2019, 9:51 AM
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
## 2019-09-13 ENCOUNTER — Inpatient Hospital Stay (HOSPITAL_COMMUNITY): Payer: Self-pay

## 2019-09-13 ENCOUNTER — Encounter (HOSPITAL_COMMUNITY): Payer: Self-pay

## 2019-09-13 ENCOUNTER — Other Ambulatory Visit: Payer: Self-pay

## 2019-09-13 LAB — GLUCOSE, CAPILLARY
Glucose-Capillary: 109 mg/dL — ABNORMAL HIGH (ref 70–99)
Glucose-Capillary: 126 mg/dL — ABNORMAL HIGH (ref 70–99)
Glucose-Capillary: 127 mg/dL — ABNORMAL HIGH (ref 70–99)
Glucose-Capillary: 139 mg/dL — ABNORMAL HIGH (ref 70–99)
Glucose-Capillary: 142 mg/dL — ABNORMAL HIGH (ref 70–99)
Glucose-Capillary: 99 mg/dL (ref 70–99)

## 2019-09-13 LAB — BASIC METABOLIC PANEL
Anion gap: 3 — ABNORMAL LOW (ref 5–15)
BUN: 20 mg/dL (ref 6–20)
CO2: 29 mmol/L (ref 22–32)
Calcium: 6.9 mg/dL — ABNORMAL LOW (ref 8.9–10.3)
Chloride: 109 mmol/L (ref 98–111)
Creatinine, Ser: 1 mg/dL (ref 0.61–1.24)
GFR calc Af Amer: >60 mL/min (ref >60)
GFR calc non Af Amer: >60 mL/min (ref >60)
Glucose, Bld: 114 mg/dL — ABNORMAL HIGH (ref 70–99)
Potassium: 3.7 mmol/L (ref 3.5–5.1)
Sodium: 141 mmol/L (ref 135–145)

## 2019-09-13 LAB — POCT I-STAT 7, (LYTES, BLD GAS, ICA,H+H)
Acid-Base Excess: 9 mmol/L — ABNORMAL HIGH (ref 0.0–2.0)
Acid-Base Excess: 9 mmol/L — ABNORMAL HIGH (ref 0.0–2.0)
Bicarbonate: 34.4 mmol/L — ABNORMAL HIGH (ref 20.0–28.0)
Bicarbonate: 34.3 mmol/L — ABNORMAL HIGH (ref 20.0–28.0)
Calcium, Ion: 1.09 mmol/L — ABNORMAL LOW (ref 1.15–1.40)
Calcium, Ion: 1.11 mmol/L — ABNORMAL LOW (ref 1.15–1.40)
HCT: 23 % — ABNORMAL LOW (ref 39.0–52.0)
HCT: 29 % — ABNORMAL LOW (ref 39.0–52.0)
Hemoglobin: 7.8 g/dL — ABNORMAL LOW (ref 13.0–17.0)
Hemoglobin: 9.9 g/dL — ABNORMAL LOW (ref 13.0–17.0)
O2 Saturation: 98 %
O2 Saturation: 95 %
Patient temperature: 36.7
Patient temperature: 37.3
Potassium: 3.7 mmol/L (ref 3.5–5.1)
Potassium: 3.7 mmol/L (ref 3.5–5.1)
Sodium: 143 mmol/L (ref 135–145)
Sodium: 141 mmol/L (ref 135–145)
TCO2: 36 mmol/L — ABNORMAL HIGH (ref 22–32)
TCO2: 36 mmol/L — ABNORMAL HIGH (ref 22–32)
pCO2 arterial: 53.9 mmHg — ABNORMAL HIGH (ref 32.0–48.0)
pCO2 arterial: 53.7 mmHg — ABNORMAL HIGH (ref 32.0–48.0)
pH, Arterial: 7.412 (ref 7.350–7.450)
pH, Arterial: 7.414 (ref 7.350–7.450)
pO2, Arterial: 106 mmHg (ref 83.0–108.0)
pO2, Arterial: 79 mmHg — ABNORMAL LOW (ref 83.0–108.0)

## 2019-09-13 LAB — MAGNESIUM: Magnesium: 1.8 mg/dL (ref 1.7–2.4)

## 2019-09-13 LAB — CBC
HCT: 25.9 % — ABNORMAL LOW (ref 39.0–52.0)
Hemoglobin: 8.4 g/dL — ABNORMAL LOW (ref 13.0–17.0)
MCH: 28.7 pg (ref 26.0–34.0)
MCHC: 32.4 g/dL (ref 30.0–36.0)
MCV: 88.4 fL (ref 80.0–100.0)
Platelets: 109 10*3/uL — ABNORMAL LOW (ref 150–400)
RBC: 2.93 MIL/uL — ABNORMAL LOW (ref 4.22–5.81)
RDW: 15.8 % — ABNORMAL HIGH (ref 11.5–15.5)
WBC: 13.1 10*3/uL — ABNORMAL HIGH (ref 4.0–10.5)
nRBC: 0.2 % (ref 0.0–0.2)

## 2019-09-13 LAB — PHOSPHORUS: Phosphorus: 2.6 mg/dL (ref 2.5–4.6)

## 2019-09-13 MED ORDER — CALCIUM GLUCONATE-NACL 2-0.675 GM/100ML-% IV SOLN
2.0000 g | Freq: Once | INTRAVENOUS | Status: AC
  Administered 2019-09-13: 2000 mg via INTRAVENOUS
  Filled 2019-09-13: qty 100

## 2019-09-13 MED ORDER — FUROSEMIDE 10 MG/ML IJ SOLN
40.0000 mg | Freq: Once | INTRAMUSCULAR | Status: AC
  Administered 2019-09-13: 40 mg via INTRAVENOUS
  Filled 2019-09-13: qty 4

## 2019-09-13 MED ORDER — POTASSIUM PHOSPHATES 15 MMOLE/5ML IV SOLN
20.0000 mmol | Freq: Once | INTRAVENOUS | Status: AC
  Administered 2019-09-13: 20 mmol via INTRAVENOUS
  Filled 2019-09-13: qty 6.67
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
## 2019-09-13 NOTE — Progress Notes (Signed)
EVENING ROUNDS NOTE :     [address]       [place] [address]             [phone]                 3 Days Post-Op Procedure(s) (LRB): THORACOTOMY MAJOR (N/A) Right Middle Lobectomy with Repair of upper and lower injuries  Total Length of Stay:  LOS: 3 days  BP (!) 142/61   Pulse 93   Temp 100 F (37.8 C) (Core)   Resp 18   Ht 5\' 9"  (1.753 m)   Wt 66.4 kg   SpO2 98%   BMI 21.62 kg/m   .Intake/Output      05/07 0701 - 05/08 0700   P.O.    I.V. (mL/kg) 1560.8 (23.5)   NG/GT 980   IV Piggyback 607   Total Intake(mL/kg) 3147.8 (47.4)   Urine (mL/kg/hr) 3810 (4.2)   Emesis/NG output 0   Chest Tube 136   Total Output 3946   Net -798.2       Emesis Occurrence 1 x     . fentaNYL infusion INTRAVENOUS 200 mcg/hr (09/13/19 2000)  . lactated ringers 100 mL/hr at 09/13/19 2000  . propofol (DIPRIVAN) infusion Stopped (09/13/19 0720)     Lab Results  Component Value Date   WBC 13.1 (H) 09/13/2019   HGB 9.9 (L) 09/13/2019   HCT 29.0 (L) 09/13/2019   PLT 109 (L) 09/13/2019   GLUCOSE 114 (H) 09/13/2019   TRIG 100 09/11/2019   ALT 64 (H) 09/11/2019   AST 101 (H) 09/11/2019   NA 141 09/13/2019   K 3.7 09/13/2019   CL 109 09/13/2019   CREATININE 1.00 09/13/2019   BUN 20 09/13/2019   CO2 29 09/13/2019   INR 0.9 09/11/2019   Not able to extubate today    11/11/2019 MD  Beeper [phone] Office [phone] 09/13/2019 8:49 PM
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
## 2019-09-13 NOTE — Progress Notes (Signed)
OT Cancellation Note  Patient Details Name: Justin Morton MRN: [id number] DOB: 1991/02/01   Cancelled Treatment:    Reason Eval/Treat Not Completed: Patient not medically ready;Other (comment)(RN stated pt is agitated with ETT and vomiting. Will await until extubated and continue to follow)  Dalphine Handing, MSOT, OTR/L Acute Rehabilitation Services [facility] Office Number: [phone] Pager: [phone]  Dalphine Handing 09/13/2019, 10:27 AM
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
## 2019-09-14 ENCOUNTER — Inpatient Hospital Stay (HOSPITAL_COMMUNITY): Payer: Self-pay

## 2019-09-14 LAB — COMPREHENSIVE METABOLIC PANEL
ALT: 39 U/L (ref 0–44)
AST: 89 U/L — ABNORMAL HIGH (ref 15–41)
Albumin: 2.4 g/dL — ABNORMAL LOW (ref 3.5–5.0)
Alkaline Phosphatase: 94 U/L (ref 38–126)
Anion gap: 6 (ref 5–15)
BUN: 18 mg/dL (ref 6–20)
CO2: 26 mmol/L (ref 22–32)
Calcium: 8 mg/dL — ABNORMAL LOW (ref 8.9–10.3)
Chloride: 105 mmol/L (ref 98–111)
Creatinine, Ser: 0.96 mg/dL (ref 0.61–1.24)
GFR calc Af Amer: >60 mL/min (ref >60)
GFR calc non Af Amer: >60 mL/min (ref >60)
Glucose, Bld: 144 mg/dL — ABNORMAL HIGH (ref 70–99)
Potassium: 5.5 mmol/L — ABNORMAL HIGH (ref 3.5–5.1)
Sodium: 137 mmol/L (ref 135–145)
Total Bilirubin: 1.3 mg/dL — ABNORMAL HIGH (ref 0.3–1.2)
Total Protein: 5.3 g/dL — ABNORMAL LOW (ref 6.5–8.1)

## 2019-09-14 LAB — TYPE AND SCREEN
ABO/RH(D): O POS
Antibody Screen: NEGATIVE
Unit division: 0
Unit division: 0
Unit division: 0
Unit division: 0
Unit division: 0
Unit division: 0
Unit division: 0
Unit division: 0
Unit division: 0
Unit division: 0
Unit division: 0
Unit division: 0
Unit division: 0
Unit division: 0
Unit division: 0
Unit division: 0
Unit division: 0
Unit division: 0
Unit division: 0
Unit division: 0
Unit division: 0
Unit division: 0
Unit division: 0
Unit division: 0
Unit division: 0
Unit division: 0
Unit division: 0
Unit division: 0
Unit division: 0
Unit division: 0
Unit division: 0
Unit division: 0
Unit division: 0
Unit division: 0
Unit division: 0
Unit division: 0
Unit division: 0
Unit division: 0
Unit division: 0
Unit division: 0
Unit division: 0
Unit division: 0
Unit division: 0
Unit division: 0
Unit division: 0
Unit division: 0
Unit division: 0
Unit division: 0
Unit division: 0

## 2019-09-14 LAB — BPAM RBC
Blood Product Expiration Date: 202106022359
Blood Product Expiration Date: 202106052359
Blood Product Expiration Date: 202106052359
Blood Product Expiration Date: 202106052359
Blood Product Expiration Date: 202106052359
Blood Product Expiration Date: 202106052359
Blood Product Expiration Date: 202106052359
Blood Product Expiration Date: 202106052359
Blood Product Expiration Date: 202106052359
Blood Product Expiration Date: 202106052359
Blood Product Expiration Date: 202106052359
Blood Product Expiration Date: 202106052359
Blood Product Expiration Date: 202106052359
Blood Product Expiration Date: 202106052359
Blood Product Expiration Date: 202106082359
Blood Product Expiration Date: 202106082359
Blood Product Expiration Date: 202106092359
Blood Product Expiration Date: 202106092359
Blood Product Expiration Date: 202106092359
Blood Product Expiration Date: 202106092359
Blood Product Expiration Date: 202106092359
Blood Product Expiration Date: 202106092359
Blood Product Expiration Date: 202106092359
Blood Product Expiration Date: 202106092359
Blood Product Expiration Date: 202106092359
Blood Product Expiration Date: 202106092359
Blood Product Expiration Date: 202106092359
Blood Product Expiration Date: 202106092359
Blood Product Expiration Date: 202106092359
Blood Product Expiration Date: 202106092359
Blood Product Expiration Date: 202106092359
Blood Product Expiration Date: 202106092359
Blood Product Expiration Date: 202106092359
Blood Product Expiration Date: 202106092359
Blood Product Expiration Date: 202106092359
Blood Product Expiration Date: 202106092359
Blood Product Expiration Date: 202106102359
Blood Product Expiration Date: 202106102359
Blood Product Expiration Date: 202106102359
Blood Product Expiration Date: 202106102359
Blood Product Expiration Date: 202106102359
Blood Product Expiration Date: 202106102359
Blood Product Expiration Date: 202106102359
Blood Product Expiration Date: 202106102359
Blood Product Expiration Date: 202106102359
Blood Product Expiration Date: 202106102359
Blood Product Expiration Date: 202106102359
Blood Product Expiration Date: 202106102359
Blood Product Expiration Date: 202106112359
ISSUE DATE / TIME: 202105042004
ISSUE DATE / TIME: 202105042004
ISSUE DATE / TIME: 202105042024
ISSUE DATE / TIME: 202105042026
ISSUE DATE / TIME: 202105042026
ISSUE DATE / TIME: 202105042026
ISSUE DATE / TIME: 202105042026
ISSUE DATE / TIME: 202105042053
ISSUE DATE / TIME: 202105042053
ISSUE DATE / TIME: 202105042053
ISSUE DATE / TIME: 202105042053
ISSUE DATE / TIME: 202105042056
ISSUE DATE / TIME: 202105042056
ISSUE DATE / TIME: 202105042056
ISSUE DATE / TIME: 202105042056
ISSUE DATE / TIME: 202105042101
ISSUE DATE / TIME: 202105042101
ISSUE DATE / TIME: 202105042122
ISSUE DATE / TIME: 202105042122
ISSUE DATE / TIME: 202105042122
ISSUE DATE / TIME: 202105042122
ISSUE DATE / TIME: 202105042132
ISSUE DATE / TIME: 202105042132
ISSUE DATE / TIME: 202105042132
ISSUE DATE / TIME: 202105042132
ISSUE DATE / TIME: 202105042150
ISSUE DATE / TIME: 202105042150
ISSUE DATE / TIME: 202105042150
ISSUE DATE / TIME: 202105042150
ISSUE DATE / TIME: 202105042301
ISSUE DATE / TIME: 202105042301
ISSUE DATE / TIME: 202105042323
ISSUE DATE / TIME: 202105042328
ISSUE DATE / TIME: 202105042328
ISSUE DATE / TIME: 202105042339
ISSUE DATE / TIME: 202105050433
ISSUE DATE / TIME: 202105050822
ISSUE DATE / TIME: 202105050829
ISSUE DATE / TIME: 202105051253
ISSUE DATE / TIME: 202105061037
ISSUE DATE / TIME: 202105061327
ISSUE DATE / TIME: 202105071354
ISSUE DATE / TIME: 202105071354
ISSUE DATE / TIME: 202105071518
ISSUE DATE / TIME: 202105071642
Unit Type and Rh: 5100
Unit Type and Rh: 5100
Unit Type and Rh: 5100
Unit Type and Rh: 5100
Unit Type and Rh: 5100
Unit Type and Rh: 5100
Unit Type and Rh: 5100
Unit Type and Rh: 5100
Unit Type and Rh: 5100
Unit Type and Rh: 5100
Unit Type and Rh: 5100
Unit Type and Rh: 5100
Unit Type and Rh: 5100
Unit Type and Rh: 5100
Unit Type and Rh: 5100
Unit Type and Rh: 5100
Unit Type and Rh: 5100
Unit Type and Rh: 5100
Unit Type and Rh: 5100
Unit Type and Rh: 5100
Unit Type and Rh: 5100
Unit Type and Rh: 5100
Unit Type and Rh: 5100
Unit Type and Rh: 5100
Unit Type and Rh: 5100
Unit Type and Rh: 5100
Unit Type and Rh: 5100
Unit Type and Rh: 5100
Unit Type and Rh: 5100
Unit Type and Rh: 5100
Unit Type and Rh: 5100
Unit Type and Rh: 5100
Unit Type and Rh: 5100
Unit Type and Rh: 5100
Unit Type and Rh: 5100
Unit Type and Rh: 5100
Unit Type and Rh: 5100
Unit Type and Rh: 5100
Unit Type and Rh: 5100
Unit Type and Rh: 5100
Unit Type and Rh: 5100
Unit Type and Rh: 5100
Unit Type and Rh: 5100
Unit Type and Rh: 5100
Unit Type and Rh: 5100
Unit Type and Rh: 5100
Unit Type and Rh: 5100
Unit Type and Rh: 5100
Unit Type and Rh: 5100

## 2019-09-14 LAB — CBC
HCT: 26.9 % — ABNORMAL LOW (ref 39.0–52.0)
Hemoglobin: 8.4 g/dL — ABNORMAL LOW (ref 13.0–17.0)
MCH: 28.3 pg (ref 26.0–34.0)
MCHC: 31.2 g/dL (ref 30.0–36.0)
MCV: 90.6 fL (ref 80.0–100.0)
Platelets: 132 10*3/uL — ABNORMAL LOW (ref 150–400)
RBC: 2.97 MIL/uL — ABNORMAL LOW (ref 4.22–5.81)
RDW: 15.3 % (ref 11.5–15.5)
WBC: 21.5 10*3/uL — ABNORMAL HIGH (ref 4.0–10.5)
nRBC: 0.2 % (ref 0.0–0.2)

## 2019-09-14 LAB — GLUCOSE, CAPILLARY
Glucose-Capillary: 125 mg/dL — ABNORMAL HIGH (ref 70–99)
Glucose-Capillary: 114 mg/dL — ABNORMAL HIGH (ref 70–99)
Glucose-Capillary: 113 mg/dL — ABNORMAL HIGH (ref 70–99)
Glucose-Capillary: 89 mg/dL (ref 70–99)
Glucose-Capillary: 104 mg/dL — ABNORMAL HIGH (ref 70–99)

## 2019-09-14 LAB — TRIGLYCERIDES: Triglycerides: 99 mg/dL (ref <150)

## 2019-09-14 LAB — MAGNESIUM: Magnesium: 1.6 mg/dL — ABNORMAL LOW (ref 1.7–2.4)

## 2019-09-14 LAB — PHOSPHORUS: Phosphorus: 2.3 mg/dL — ABNORMAL LOW (ref 2.5–4.6)

## 2019-09-14 MED ORDER — SODIUM CHLORIDE 0.9 % IV SOLN
INTRAVENOUS | Status: DC
  Administered 2019-09-17: 1000 mL via INTRAVENOUS

## 2019-09-14 MED ORDER — FUROSEMIDE 10 MG/ML IJ SOLN
40.0000 mg | Freq: Once | INTRAMUSCULAR | Status: AC
  Administered 2019-09-14: 40 mg via INTRAVENOUS
  Filled 2019-09-14: qty 4

## 2019-09-14 MED ORDER — SODIUM POLYSTYRENE SULFONATE 15 GM/60ML PO SUSP
45.0000 g | Freq: Once | ORAL | Status: AC
  Administered 2019-09-14: 45 g
  Filled 2019-09-14 (×2): qty 180

## 2019-09-14 MED ORDER — DOCUSATE SODIUM 50 MG/5ML PO LIQD
100.0000 mg | Freq: Two times a day (BID) | ORAL | Status: DC
  Administered 2019-09-14 – 2019-09-15 (×3): 100 mg
  Filled 2019-09-14 (×3): qty 10

## 2019-09-14 MED ORDER — POLYETHYLENE GLYCOL 3350 17 G PO PACK
17.0000 g | PACK | Freq: Every day | ORAL | Status: DC
  Administered 2019-09-14 – 2019-09-15 (×2): 17 g
  Filled 2019-09-14 (×2): qty 1
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
## 2019-09-14 NOTE — Progress Notes (Signed)
Patient ID: Justin Morton, male   DOB: [birth year], 29 y.o.   MRN: [id number] EVENING ROUNDS NOTE :     [address]       [place] [address]             [phone]                 4 Days Post-Op Procedure(s) (LRB): THORACOTOMY MAJOR (N/A) Right Middle Lobectomy with Repair of upper and lower injuries  Total Length of Stay:  LOS: 4 days  BP 121/75 (BP Location: Left Arm)   Pulse (!) 103   Temp (!) 100.4 F (38 C) (Bladder)   Resp 18   Ht 5\' 9"  (1.753 m)   Wt 65.8 kg   SpO2 97%   BMI 21.42 kg/m   .Intake/Output      05/08 0701 - 05/09 0700   I.V. (mL/kg) 981.3 (14.9)   NG/GT 140   IV Piggyback    Total Intake(mL/kg) 1121.3 (17)   Urine (mL/kg/hr) 3800 (4.5)   Emesis/NG output 300   Chest Tube 80   Total Output 4180   Net -3058.7       Emesis Occurrence 1 x     . sodium chloride 75 mL/hr at 09/14/19 1800  . fentaNYL infusion INTRAVENOUS 150 mcg/hr (09/14/19 1800)  . propofol (DIPRIVAN) infusion Stopped (09/13/19 0720)     Lab Results  Component Value Date   WBC 21.5 (H) 09/14/2019   HGB 8.4 (L) 09/14/2019   HCT 26.9 (L) 09/14/2019   PLT 132 (L) 09/14/2019   GLUCOSE 144 (H) 09/14/2019   TRIG 99 09/14/2019   ALT 39 09/14/2019   AST 89 (H) 09/14/2019   NA 137 09/14/2019   K 5.5 (H) 09/14/2019   CL 105 09/14/2019   CREATININE 0.96 09/14/2019   BUN 18 09/14/2019   CO2 26 09/14/2019   INR 0.9 09/11/2019   remains sedated on vent Vs stable    11/11/2019 MD  Beeper [phone] Office [phone] 09/14/2019 7:43 PM

## 2019-09-14 NOTE — Progress Notes (Signed)
PT Cancellation Note late entry for 09/13/19 1010  Patient Details Name: Justin Morton MRN: [id number] DOB: 01-30-1991   Cancelled Treatment:    Reason Eval/Treat Not Completed: Patient not medically ready(Pt remains intubated and per RN not appropriate with agitation/vomiting)   Enedina Finner Jaquitta Dupriest 09/14/2019, 7:18 AM Merryl Hacker, PT Acute Rehabilitation Services Pager: [phone] Office: [phone]
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
## 2019-09-14 NOTE — Progress Notes (Signed)
OT Cancellation Note  Patient Details Name: Seydina Holliman MRN: [id number] DOB: 09-06-1990   Cancelled Treatment:    Reason Eval/Treat Not Completed: Patient not medically ready(RN hold. Pt had a bad morning and requires rest.)   Flora Lipps, OTR/L Acute Rehabilitation Services Pager: [phone] Office: [phone]   Hermione Havlicek C 09/14/2019, 12:29 PM

## 2019-09-14 NOTE — Progress Notes (Signed)
RT note-While advancing the ETT, patient vomited amount emesis, RN connected suction to gastric tube, no aspiration noted at this time, large amount thick old bloody clots, ETT holder changed at this time as well. Continue to monitor.
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
## 2019-09-15 ENCOUNTER — Inpatient Hospital Stay (HOSPITAL_COMMUNITY): Payer: Self-pay

## 2019-09-15 LAB — COMPREHENSIVE METABOLIC PANEL
ALT: 48 U/L — ABNORMAL HIGH (ref 0–44)
AST: 98 U/L — ABNORMAL HIGH (ref 15–41)
Albumin: 2.4 g/dL — ABNORMAL LOW (ref 3.5–5.0)
Alkaline Phosphatase: 100 U/L (ref 38–126)
Anion gap: 8 (ref 5–15)
BUN: 18 mg/dL (ref 6–20)
CO2: 27 mmol/L (ref 22–32)
Calcium: 8.2 mg/dL — ABNORMAL LOW (ref 8.9–10.3)
Chloride: 101 mmol/L (ref 98–111)
Creatinine, Ser: 0.81 mg/dL (ref 0.61–1.24)
GFR calc Af Amer: >60 mL/min (ref >60)
GFR calc non Af Amer: >60 mL/min (ref >60)
Glucose, Bld: 112 mg/dL — ABNORMAL HIGH (ref 70–99)
Potassium: 4 mmol/L (ref 3.5–5.1)
Sodium: 136 mmol/L (ref 135–145)
Total Bilirubin: 1.5 mg/dL — ABNORMAL HIGH (ref 0.3–1.2)
Total Protein: 5.6 g/dL — ABNORMAL LOW (ref 6.5–8.1)

## 2019-09-15 LAB — GLUCOSE, CAPILLARY
Glucose-Capillary: 98 mg/dL (ref 70–99)
Glucose-Capillary: 97 mg/dL (ref 70–99)
Glucose-Capillary: 95 mg/dL (ref 70–99)
Glucose-Capillary: 92 mg/dL (ref 70–99)
Glucose-Capillary: 90 mg/dL (ref 70–99)

## 2019-09-15 LAB — CBC
HCT: 26 % — ABNORMAL LOW (ref 39.0–52.0)
Hemoglobin: 8.2 g/dL — ABNORMAL LOW (ref 13.0–17.0)
MCH: 28.1 pg (ref 26.0–34.0)
MCHC: 31.5 g/dL (ref 30.0–36.0)
MCV: 89 fL (ref 80.0–100.0)
Platelets: 169 10*3/uL (ref 150–400)
RBC: 2.92 MIL/uL — ABNORMAL LOW (ref 4.22–5.81)
RDW: 15 % (ref 11.5–15.5)
WBC: 19.1 10*3/uL — ABNORMAL HIGH (ref 4.0–10.5)
nRBC: 0.1 % (ref 0.0–0.2)

## 2019-09-15 NOTE — Progress Notes (Addendum)
Inpatient Rehab Admissions Coordinator Note:   Per PT recommendation, pt was screened for CIR candidacy by Wolfgang Phoenix, MS, CCC-SLP  At this time we are not recommending Inpatient Rehab consult.  Will follow pt from afar d/t multiple precautions (I.e., OG tube, ETT).  Please contact me with questions.   Wolfgang Phoenix, MS, CCC-SLP Admissions Coordinator [phone]

## 2019-09-15 NOTE — Procedures (Signed)
Extubation Procedure Note  Patient Details:   Name: Justin Morton DOB: 04/07/1991 MRN: [id number]   Airway Documentation:    Vent end date: (not recorded) Vent end time: (not recorded)   Evaluation  O2 sats: stable throughout Complications: No apparent complications Patient did tolerate procedure well. Bilateral Breath Sounds: Clear   Yes  Placed on 4l/min [place] Incentive spirometer and flutter  Newt Lukes 09/15/2019, 5:12 PM
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
## 2019-09-16 ENCOUNTER — Inpatient Hospital Stay (HOSPITAL_COMMUNITY): Payer: Self-pay

## 2019-09-16 LAB — CBC
HCT: 25.5 % — ABNORMAL LOW (ref 39.0–52.0)
Hemoglobin: 8.2 g/dL — ABNORMAL LOW (ref 13.0–17.0)
MCH: 28 pg (ref 26.0–34.0)
MCHC: 32.2 g/dL (ref 30.0–36.0)
MCV: 87 fL (ref 80.0–100.0)
Platelets: 204 10*3/uL (ref 150–400)
RBC: 2.93 MIL/uL — ABNORMAL LOW (ref 4.22–5.81)
RDW: 14.5 % (ref 11.5–15.5)
WBC: 12.3 10*3/uL — ABNORMAL HIGH (ref 4.0–10.5)
nRBC: 0.2 % (ref 0.0–0.2)

## 2019-09-16 LAB — BASIC METABOLIC PANEL
Anion gap: 11 (ref 5–15)
BUN: 14 mg/dL (ref 6–20)
CO2: 25 mmol/L (ref 22–32)
Calcium: 7.9 mg/dL — ABNORMAL LOW (ref 8.9–10.3)
Chloride: 103 mmol/L (ref 98–111)
Creatinine, Ser: 0.83 mg/dL (ref 0.61–1.24)
GFR calc Af Amer: >60 mL/min (ref >60)
GFR calc non Af Amer: >60 mL/min (ref >60)
Glucose, Bld: 103 mg/dL — ABNORMAL HIGH (ref 70–99)
Potassium: 3.2 mmol/L — ABNORMAL LOW (ref 3.5–5.1)
Sodium: 139 mmol/L (ref 135–145)

## 2019-09-16 LAB — GLUCOSE, CAPILLARY
Glucose-Capillary: 100 mg/dL — ABNORMAL HIGH (ref 70–99)
Glucose-Capillary: 110 mg/dL — ABNORMAL HIGH (ref 70–99)
Glucose-Capillary: 111 mg/dL — ABNORMAL HIGH (ref 70–99)
Glucose-Capillary: 114 mg/dL — ABNORMAL HIGH (ref 70–99)
Glucose-Capillary: 123 mg/dL — ABNORMAL HIGH (ref 70–99)
Glucose-Capillary: 136 mg/dL — ABNORMAL HIGH (ref 70–99)
Glucose-Capillary: 86 mg/dL (ref 70–99)
Glucose-Capillary: 99 mg/dL (ref 70–99)

## 2019-09-16 MED ORDER — DOCUSATE SODIUM 100 MG PO CAPS
100.0000 mg | ORAL_CAPSULE | Freq: Two times a day (BID) | ORAL | Status: DC
  Administered 2019-09-16 – 2019-09-19 (×5): 100 mg via ORAL
  Filled 2019-09-16 (×6): qty 1

## 2019-09-16 MED ORDER — METHOCARBAMOL 500 MG PO TABS
1000.0000 mg | ORAL_TABLET | Freq: Three times a day (TID) | ORAL | Status: DC
  Administered 2019-09-16 – 2019-09-19 (×9): 1000 mg via ORAL
  Filled 2019-09-16 (×10): qty 2

## 2019-09-16 MED ORDER — ACETAMINOPHEN 500 MG PO TABS
1000.0000 mg | ORAL_TABLET | Freq: Four times a day (QID) | ORAL | Status: DC
  Administered 2019-09-16 – 2019-09-19 (×11): 1000 mg via ORAL
  Filled 2019-09-16 (×12): qty 2

## 2019-09-16 MED ORDER — OXYCODONE HCL 5 MG PO TABS
10.0000 mg | ORAL_TABLET | ORAL | Status: DC | PRN
  Administered 2019-09-16 – 2019-09-19 (×6): 10 mg via ORAL
  Filled 2019-09-16 (×5): qty 2

## 2019-09-16 MED ORDER — POLYETHYLENE GLYCOL 3350 17 G PO PACK
17.0000 g | PACK | Freq: Every day | ORAL | Status: DC
  Filled 2019-09-16: qty 1

## 2019-09-16 MED ORDER — POTASSIUM CHLORIDE 20 MEQ/15ML (10%) PO SOLN
40.0000 meq | ORAL | Status: AC
  Administered 2019-09-16 (×2): 40 meq via ORAL
  Filled 2019-09-16 (×2): qty 30

## 2019-09-16 MED FILL — Heparin Sodium (Porcine) Inj 1000 Unit/ML: INTRAMUSCULAR | Qty: 60 | Status: AC

## 2019-09-16 MED FILL — Sodium Chloride IV Soln 0.9%: INTRAVENOUS | Qty: 7000 | Status: AC
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
## 2019-09-16 NOTE — Progress Notes (Signed)
EVENING ROUNDS NOTE :     [address]       [place] [address]             [phone]                 6 Days Post-Op Procedure(s) (LRB): THORACOTOMY MAJOR (N/A) Right Middle Lobectomy with Repair of upper and lower injuries   Total Length of Stay:  LOS: 6 days  Events:  Doing well No events    BP (!) 119/55   Pulse 96   Temp 98.9 F (37.2 C)   Resp (!) 23   Ht 5\' 9"  (1.753 m)   Wt 60.4 kg   SpO2 96%   BMI 19.66 kg/m         . sodium chloride 75 mL/hr at 09/16/19 1300    I/O last 3 completed shifts: In: 3233.3 [I.V.:3033.3; NG/GT:200] Out: 4675 [Urine:3700; Emesis/NG output:650; Chest Tube:325]   CBC Latest Ref Rng & Units 09/16/2019 09/15/2019 09/14/2019  WBC 4.0 - 10.5 K/uL 12.3(H) 19.1(H) 21.5(H)  Hemoglobin 13.0 - 17.0 g/dL 8.2(L) 8.2(L) 8.4(L)  Hematocrit 39.0 - 52.0 % 25.5(L) 26.0(L) 26.9(L)  Platelets 150 - 400 K/uL 204 169 132(L)    BMP Latest Ref Rng & Units 09/16/2019 09/15/2019 09/14/2019  Glucose 70 - 99 mg/dL 11/14/2019) 027(O) 536(U)  BUN 6 - 20 mg/dL 14 18 18   Creatinine 0.61 - 1.24 mg/dL 440(H 4.74  Sodium 135 - 145 mmol/L 139 136 137  Potassium 3.5 - 5.1 mmol/L 3.2(L) 4.0 5.5(H)  Chloride 98 - 111 mmol/L 103 101 105  CO2 22 - 32 mmol/L 25 27 26   Calcium 8.9 - 10.3 mg/dL 7.9(L) 8.2(L) 8.0(L)    ABG    Component Value Date/Time   PHART 7.414 09/13/2019 0910   PCO2ART 53.7 (H) 09/13/2019 0910   PO2ART 79 (L) 09/13/2019 0910   HCO3 34.3 (H) 09/13/2019 0910   TCO2 36 (H) 09/13/2019 0910   ACIDBASEDEF 10.0 (H) 09/10/2019 2342   O2SAT 95.0 09/13/2019 0910       11/13/2019, MD 09/16/2019 4:24 PM

## 2019-09-16 NOTE — Progress Notes (Signed)
6 Days Post-Op Procedure(s) (LRB): THORACOTOMY MAJOR (N/A) Right Middle Lobectomy with Repair of upper and lower injuries Subjective: C/o incisional pain, wants to eat  Objective: Vital signs in last 24 hours: Temp:  [98.8 F (37.1 C)-100.9 F (38.3 C)] 98.8 F (37.1 C) (05/10 0738) Pulse Rate:  [78-99] 95 (05/10 0800) Cardiac Rhythm: Normal sinus rhythm (05/10 0800) Resp:  [18-37] 20 (05/10 0800) BP: (92-123)/(54-81) 119/72 (05/10 0700) SpO2:  [95 %-100 %] 99 % (05/10 0800) Arterial Line BP: (117-147)/(52-69) 134/56 (05/09 1600) FiO2 (%):  [40 %] 40 % (05/09 1510) Weight:  [60.4 kg] 60.4 kg (05/10 0500)  Hemodynamic parameters for last 24 hours:    Intake/Output from previous day: 05/09 0701 - 05/10 0700 In: 2144.6 [I.V.:1944.6; NG/GT:200] Out: 2795 [Urine:2275; Emesis/NG output:300; Chest Tube:220] Intake/Output this shift: No intake/output data recorded.  General appearance: alert, cooperative and no distress Neurologic: intact Heart: regular rate and rhythm Lungs: diminished breath sounds bibasilar no air leak  Lab Results: Recent Labs    09/15/19 0437 09/16/19 0230  WBC 19.1* 12.3*  HGB 8.2* 8.2*  HCT 26.0* 25.5*  PLT 169 204   BMET:  Recent Labs    09/15/19 0437 09/16/19 0230  NA 136 139  K 4.0 3.2*  CL 101 103  CO2 27 25  GLUCOSE 112* 103*  BUN 18 14  CREATININE 0.81 0.83  CALCIUM 8.2* 7.9*    PT/INR: No results for input(s): LABPROT, INR in the last 72 hours. ABG    Component Value Date/Time   PHART 7.414 09/13/2019 0910   HCO3 34.3 (H) 09/13/2019 0910   TCO2 36 (H) 09/13/2019 0910   ACIDBASEDEF 10.0 (H) 09/10/2019 2342   O2SAT 95.0 09/13/2019 0910   CBG (last 3)  Recent Labs    09/16/19 0016 09/16/19 0402 09/16/19 0735  GLUCAP 99 100* 110*    Assessment/Plan: S/P Procedure(s) (LRB): THORACOTOMY MAJOR (N/A) Right Middle Lobectomy with Repair of upper and lower injuries -POD # 6 Extubated yesterday No air leak and minimal  drainage- will dc posterior CT, keep anterior tube one more day on water seal Hypokalemia- supplement Trauma has ordered a swallowing eval, hopefully can advance diet once that is complete Ambulating    LOS: 6 days    Loreli Slot 09/16/2019
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
## 2019-09-16 NOTE — Plan of Care (Signed)
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
## 2019-09-17 ENCOUNTER — Inpatient Hospital Stay (HOSPITAL_COMMUNITY): Payer: Self-pay

## 2019-09-17 LAB — BASIC METABOLIC PANEL
Anion gap: 9 (ref 5–15)
BUN: 9 mg/dL (ref 6–20)
CO2: 25 mmol/L (ref 22–32)
Calcium: 7.8 mg/dL — ABNORMAL LOW (ref 8.9–10.3)
Chloride: 105 mmol/L (ref 98–111)
Creatinine, Ser: 0.67 mg/dL (ref 0.61–1.24)
GFR calc Af Amer: >60 mL/min (ref >60)
GFR calc non Af Amer: >60 mL/min (ref >60)
Glucose, Bld: 121 mg/dL — ABNORMAL HIGH (ref 70–99)
Potassium: 3.4 mmol/L — ABNORMAL LOW (ref 3.5–5.1)
Sodium: 139 mmol/L (ref 135–145)

## 2019-09-17 LAB — CBC
HCT: 24.4 % — ABNORMAL LOW (ref 39.0–52.0)
Hemoglobin: 7.9 g/dL — ABNORMAL LOW (ref 13.0–17.0)
MCH: 27.7 pg (ref 26.0–34.0)
MCHC: 32.4 g/dL (ref 30.0–36.0)
MCV: 85.6 fL (ref 80.0–100.0)
Platelets: 317 10*3/uL (ref 150–400)
RBC: 2.85 MIL/uL — ABNORMAL LOW (ref 4.22–5.81)
RDW: 13.8 % (ref 11.5–15.5)
WBC: 15.8 10*3/uL — ABNORMAL HIGH (ref 4.0–10.5)
nRBC: 0.3 % — ABNORMAL HIGH (ref 0.0–0.2)

## 2019-09-17 MED ORDER — POTASSIUM CHLORIDE 20 MEQ/15ML (10%) PO SOLN
40.0000 meq | Freq: Two times a day (BID) | ORAL | Status: AC
  Administered 2019-09-17 (×2): 40 meq via ORAL
  Filled 2019-09-17 (×2): qty 30

## 2019-09-17 MED ORDER — ENSURE ENLIVE PO LIQD
237.0000 mL | Freq: Three times a day (TID) | ORAL | Status: DC
  Administered 2019-09-17 – 2019-09-19 (×5): 237 mL via ORAL

## 2019-09-17 MED ORDER — ADULT MULTIVITAMIN W/MINERALS CH
1.0000 | ORAL_TABLET | Freq: Every day | ORAL | Status: DC
  Administered 2019-09-18 – 2019-09-19 (×2): 1 via ORAL
  Filled 2019-09-17 (×2): qty 1

## 2019-09-17 NOTE — Progress Notes (Signed)
Chest tube removed per 6N RN request. Patient did well with no distress. Dressing dry and intact, site without any drainage. Lashan Macias, [place] Scrape, RN
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
## 2019-09-17 NOTE — TOC Progression Note (Signed)
Transition of Care [facility]) - Progression Note    Patient Details  Name: Prashant Glosser MRN: [id number] Date of Birth: [birth year]  Transition of Care [facility]) CM/SW Contact  Ella Bodo, RN Phone Number: 09/17/2019, 4:08 PM  Clinical Narrative:  Met with pt and family to discuss dc plans; pt plans to return home with his mother at dc.  PT/OT recommending no OP follow up.  School excuse letter prepared and faxed to pt's daughter's school, per request of pt/girlfriend. Original given to significant other.      Expected Discharge Plan: Home/Self Care Barriers to Discharge: Continued Medical Work up  Expected Discharge Plan and Services Expected Discharge Plan: Home/Self Care   Discharge Planning Services: CM Consult   Living arrangements for the past 2 months: Single Family Home                                       Social Determinants of Health (SDOH) Interventions    Readmission Risk Interventions No flowsheet data found.  Reinaldo Raddle, RN, BSN  Trauma/Neuro ICU Case Manager [phone]
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
## 2019-09-17 NOTE — Discharge Instructions (Signed)
Thoracotomy, Care After This sheet gives you information about how to care for yourself after your procedure. Your doctor may also give you more specific instructions. If you have problems or questions, contact your doctor. Follow these instructions at home: Preventing lung infection (pneumonia)  Take deep breaths or do breathing exercises as told by your doctor.  Cough often. Coughing is important to clear thick spit (phlegm) and open your lungs. If coughing hurts, hold a pillow against your chest or place both hands flat on top of your cut (splinting) when you cough. This may help with discomfort.  Use an incentive spirometer as told. This is a tool that measures how well you fill your lungs with each breath.  Do lung therapy (pulmonary rehabilitation) as told. Medicines  Take over-the-counter or prescription medicines only as told by your doctor.  If you have pain, take pain-relieving medicine before your pain gets very bad. This will help you breathe and cough more comfortably.  If you were prescribed an antibiotic medicine, take it as told by your doctor. Do not stop taking the antibiotic even if you start to feel better. Activity   Ask your doctor what activities are safe for you.  Do not travel by airplane for 2 weeks after your chest tube is removed, or until your doctor says that this is safe.  Do not lift anything that is heavier than 10 lb (4.5 kg), or the limit that your doctor tells you, until he or she says that it is safe.  Do not drive until your doctor approves. ? Do not drive or use heavy machinery while taking prescription pain medicine. Incision care   Follow instructions from your doctor about how to take care of your cut from surgery (incision). Make sure you: ? Wash your hands with soap and water before you change your bandage (dressing). If you cannot use soap and water, use hand sanitizer. ? Change your bandage as told by your doctor. ? Leave stitches  (sutures), skin glue, or skin tape (adhesive) strips in place. They may need to stay in place for 2 weeks or longer. If tape strips get loose and curl up, you may trim the loose edges. Do not remove tape strips completely unless your doctor says it is okay.  Keep your bandage dry.  Check your cut from surgery every day for signs of infection. Check for: ? More redness, swelling, or pain. ? More fluid or blood. ? Warmth. ? Pus or a bad smell. Bathing  Do not take baths, swim, or use a hot tub until your doctor approves. You may take showers.  After your bandage has been removed, use soap and water to gently wash your cut from surgery. Do not use anything else to clean your cut unless your doctor tells you to. Eating and drinking  Eat a healthy diet as told by your doctor. A healthy diet includes: ? Fresh fruits and vegetables. ? Whole grains. ? Low-fat (lean) proteins.  Drink enough fluid to keep your pee (urine) clear or pale yellow. General instructions  To prevent or treat trouble pooping (constipation) while you are taking prescription pain medicine, your doctor may recommend that you: ? Take over-the-counter or prescription medicines. ? Eat foods that are high in fiber. These include fresh fruits and vegetables, whole grains, and beans. ? Limit foods that are high in fat and processed sugars, such as fried and sweet foods.  Do not use any products that contain nicotine or tobacco. These include   cigarettes and e-cigarettes. If you need help quitting, ask your doctor.  Avoid secondhand smoke.  Wear compression stockings as told. These help to prevent blood clots and reduce swelling in your legs.  If you have a chest tube, care for it as told.  Keep all follow-up visits as told by your doctor. This is important. Contact a doctor if:  You have more redness, swelling, or pain around your cut from surgery.  You have more fluid or blood coming from your cut from  surgery.  Your cut from surgery feels warm to the touch.  You have pus or a bad smell coming from your cut from surgery.  You have a fever or chills.  Your heartbeat seems uneven.  You feel sick to your stomach (nauseous).  You throw up (vomit).  You have muscle aches.  You have trouble pooping (having a bowel movement). This may mean that you: ? Poop fewer times in a week than normal. ? Have a hard time pooping. ? Have poop that is dry, hard, or bigger than normal. Get help right away if:  You get a rash.  You feel light-headed.  You feel like you might pass out (faint).  You are short of breath.  You have trouble breathing.  You are confused.  You have trouble talking.  You have problems with your seeing (vision).  You are not able to move.  You lose feeling (have numbness) in your: ? Face. ? Arms. ? Legs.  You pass out.  You have a sudden, bad headache.  You feel weak.  You have chest pain.  You have pain that: ? Is very bad. ? Gets worse, even with medicine. Summary  Take deep breaths, do breathing exercises, and cough often. This helps prevent lung infection (pneumonia).  Do not drive until your doctor approves. Do not travel by airplane for 2 weeks after your chest tube is removed, or until your doctor says that this is safe.  Check your cut from surgery every day for signs of infection.  Eat a healthy diet. This includes fresh fruits and vegetables, whole grains, and low-fat (lean) proteins. This information is not intended to replace advice given to you by your health care provider. Make sure you discuss any questions you have with your health care provider. Document Revised: 04/07/2017 Document Reviewed: 01/18/2016 Elsevier Patient Education  2020 [employer]   Lung Resection, Care After This sheet gives you information about how to care for yourself after your procedure. Your health care provider may also give you more specific  instructions. If you have problems or questions, contact your health care provider. What can I expect after the procedure? After the procedure, it is common to have:  Pain in your throat and near your incisions.  Pain when taking deep breaths.  Nausea.  Tiredness (fatigue). Follow these instructions at home:  Medicines  Take over-the-counter and prescription medicines only as told by your health care provider.  If you were prescribed an antibiotic medicine, take it as told by your health care provider. Do not stop taking the antibiotic even if you start to feel better.  If you are taking prescription pain medicine, take actions to prevent or treat constipation. Your health care provider may recommend that you: ? Drink enough fluid to keep your urine pale yellow. ? Eat foods that are high in fiber, such as fresh fruits and vegetables, whole grains, and beans. ? Limit foods that are high in fat and processed sugars,  such as fried or sweet foods. ? Take an over-the-counter or prescription medicine for constipation. Incision care  Follow instructions from your health care provider about how to take care of your incisions. Make sure you: ? Wash your hands with soap and water before you change your bandage (dressing). If soap and water are not available, use hand sanitizer. ? Change your dressing as told by your health care provider. ? Leave stitches (sutures), skin glue, or adhesive strips in place. These skin closures may need to stay in place for 2 weeks or longer. If adhesive strip edges start to loosen and curl up, you may trim the loose edges. Do not remove adhesive strips completely unless your health care provider tells you to do that.  Check your incision area every day for signs of infection. Check for: ? Redness, swelling, or pain. ? Fluid or blood. ? Pus or a bad smell. ? Warmth.  Do not take baths, swim, or use a hot tub until your health care provider approves. Ask your  health care provider if you may take showers. Preventing pneumonia   Do breathing exercises as instructed by your health care provider. Doing this helps prevent lung infection (pneumonia).  Try to breathe deeply and cough as told by your health care provider. Holding a pillow firmly over your ribs may help with discomfort.  If you were given an incentive spirometer in the hospital, continue to use it as directed by your health care provider.  Participate in pulmonary rehabilitation as directed by your health care provider. This is a program that combines education, exercise, and support from a team of specialists. The goal is to help you heal and get back to your normal activities as soon as possible. Activity  Rest as told by your health care provider.  Avoid sitting for a long time without moving. Get up to take short walks every 1-2 hours. This is important to improve blood flow and breathing. Ask for help if you feel weak or unsteady.  Ask your health care provider what activities are safe for you.  Do not lift anything that is heavier than 10 lb (4.5 kg), or the limit that you are told, until your health care provider says that it is safe.  Return to a normal diet and activities as told by your health care provider. General instructions  Wear compression stockings as told by your health care provider. These stockings help to prevent blood clots and reduce swelling in your legs.  If you have a chest tube, care for it as instructed by your health care provider. Do not travel by airplane during the 2 weeks after your chest tube is removed, or until your health care provider says that this is safe.  Do not use any products that contain nicotine or tobacco, such as cigarettes and e-cigarettes. These can delay healing after surgery. If you need help quitting, ask your health care provider.  Do not drive until your health care provider approves.  Do not drive or use heavy machinery  while taking prescription pain medicine.  Keep all follow-up visits as told by your health care provider. This is important. Contact a health care provider if you:  Have redness, swelling, or pain around your incision.  Have fluid or blood coming from your incision.  Have pus or a bad smell coming from your incision or bandage.  Have an incision that feels warm to the touch.  Have a fever or chills.  Notice that your  incision is breaking open.  Cough up blood or pus, or you develop a cough that produces bad-smelling sputum.  Have pain or swelling in your legs.  Have increasing pain that is not controlled with medicine.  Have trouble managing any of the tubes that have been left in place after surgery. Get help right away if you:  Have chest pain or an irregular or rapid heartbeat.  Feel weak, light-headed, or dizzy.  Have shortness of breath or difficulty breathing.  Have persistent nausea or vomiting.  Have a rash. These symptoms may represent a serious problem that is an emergency. Do not wait to see if the symptoms will go away. Get medical help right away. Call your local emergency services (911 in the [place] Do not drive yourself to the hospital. Summary  After lung resection surgery, it is common to have pain around your incisions, pain when taking deep breaths, nausea, and fatigue.  Follow instructions from your health care provider about how to take care of your incisions.  Be sure to contact your health care provider if you have any redness or swelling around your incision area or if blood, pus, or other fluid drains from your incision. This information is not intended to replace advice given to you by your health care provider. Make sure you discuss any questions you have with your health care provider. Document Revised: 06/18/2018 Document Reviewed: 05/08/2017 Elsevier Patient Education  2020 [employer].

## 2019-09-18 ENCOUNTER — Inpatient Hospital Stay (HOSPITAL_COMMUNITY): Payer: Self-pay

## 2019-09-18 LAB — CBC
HCT: 25.1 % — ABNORMAL LOW (ref 39.0–52.0)
Hemoglobin: 8.1 g/dL — ABNORMAL LOW (ref 13.0–17.0)
MCH: 27.6 pg (ref 26.0–34.0)
MCHC: 32.3 g/dL (ref 30.0–36.0)
MCV: 85.4 fL (ref 80.0–100.0)
Platelets: 496 10*3/uL — ABNORMAL HIGH (ref 150–400)
RBC: 2.94 MIL/uL — ABNORMAL LOW (ref 4.22–5.81)
RDW: 14.1 % (ref 11.5–15.5)
WBC: 19.1 10*3/uL — ABNORMAL HIGH (ref 4.0–10.5)
nRBC: 0.4 % — ABNORMAL HIGH (ref 0.0–0.2)

## 2019-09-18 LAB — BASIC METABOLIC PANEL
Anion gap: 8 (ref 5–15)
BUN: 9 mg/dL (ref 6–20)
CO2: 24 mmol/L (ref 22–32)
Calcium: 7.9 mg/dL — ABNORMAL LOW (ref 8.9–10.3)
Chloride: 105 mmol/L (ref 98–111)
Creatinine, Ser: 0.68 mg/dL (ref 0.61–1.24)
GFR calc Af Amer: >60 mL/min (ref >60)
GFR calc non Af Amer: >60 mL/min (ref >60)
Glucose, Bld: 120 mg/dL — ABNORMAL HIGH (ref 70–99)
Potassium: 3.6 mmol/L (ref 3.5–5.1)
Sodium: 137 mmol/L (ref 135–145)

## 2019-09-18 LAB — EXPECTORATED SPUTUM ASSESSMENT W GRAM STAIN, RFLX TO RESP C

## 2019-09-18 MED ORDER — WHITE PETROLATUM EX OINT
TOPICAL_OINTMENT | CUTANEOUS | Status: AC
  Filled 2019-09-18: qty 28.35

## 2019-09-18 MED ORDER — LEVOFLOXACIN 750 MG PO TABS
750.0000 mg | ORAL_TABLET | Freq: Every day | ORAL | Status: DC
  Administered 2019-09-18 – 2019-09-19 (×2): 750 mg via ORAL
  Filled 2019-09-18 (×2): qty 1
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
## 2019-09-19 ENCOUNTER — Encounter: Payer: Self-pay | Admitting: Anesthesiology

## 2019-09-19 ENCOUNTER — Inpatient Hospital Stay (HOSPITAL_COMMUNITY): Payer: Self-pay

## 2019-09-19 LAB — CBC
HCT: 26.3 % — ABNORMAL LOW (ref 39.0–52.0)
Hemoglobin: 8.7 g/dL — ABNORMAL LOW (ref 13.0–17.0)
MCH: 28.1 pg (ref 26.0–34.0)
MCHC: 33.1 g/dL (ref 30.0–36.0)
MCV: 84.8 fL (ref 80.0–100.0)
Platelets: 672 10*3/uL — ABNORMAL HIGH (ref 150–400)
RBC: 3.1 MIL/uL — ABNORMAL LOW (ref 4.22–5.81)
RDW: 14.2 % (ref 11.5–15.5)
WBC: 21.5 10*3/uL — ABNORMAL HIGH (ref 4.0–10.5)
nRBC: 0.6 % — ABNORMAL HIGH (ref 0.0–0.2)

## 2019-09-19 MED ORDER — OXYCODONE HCL 10 MG PO TABS: 5.0000 mg | ORAL_TABLET | ORAL | 0 refills | Status: AC | PRN

## 2019-09-19 MED ORDER — ACETAMINOPHEN 500 MG PO TABS: 1000.0000 mg | ORAL_TABLET | Freq: Four times a day (QID) | ORAL | 0 refills | Status: AC | PRN

## 2019-09-19 MED ORDER — SODIUM CHLORIDE 0.9% FLUSH
3.0000 mL | Freq: Two times a day (BID) | INTRAVENOUS | Status: DC
  Administered 2019-09-19: 3 mL via INTRAVENOUS

## 2019-09-19 MED ORDER — SODIUM CHLORIDE 0.9% FLUSH: 3.0000 mL | INTRAVENOUS | Status: DC | PRN

## 2019-09-19 MED ORDER — AMOXICILLIN-POT CLAVULANATE 875-125 MG PO TABS: 1.0000 | ORAL_TABLET | Freq: Two times a day (BID) | ORAL | 0 refills | Status: AC

## 2019-09-19 MED ORDER — SACCHAROMYCES BOULARDII 250 MG PO CAPS: 250.0000 mg | ORAL_CAPSULE | Freq: Two times a day (BID) | ORAL | Status: DC

## 2019-09-19 MED ORDER — SACCHAROMYCES BOULARDII 250 MG PO CAPS: 250.0000 mg | ORAL_CAPSULE | Freq: Two times a day (BID) | ORAL | 0 refills | Status: AC

## 2019-09-19 MED ORDER — METHOCARBAMOL 500 MG PO TABS: 1000.0000 mg | ORAL_TABLET | Freq: Three times a day (TID) | ORAL | 0 refills | Status: AC | PRN

## 2019-09-19 MED ORDER — AMOXICILLIN-POT CLAVULANATE 875-125 MG PO TABS: 1.0000 | ORAL_TABLET | Freq: Two times a day (BID) | ORAL | Status: DC

## 2019-09-19 MED ORDER — SODIUM CHLORIDE 0.9 % IV SOLN: 250.0000 mL | INTRAVENOUS | Status: DC | PRN

## 2019-09-19 NOTE — Addendum Note (Signed)
Addendum  created 09/19/19 1832 by Shelton Silvas, MD   Intraprocedure Event edited
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
## 2019-09-19 NOTE — Progress Notes (Signed)
Removed 14 staples from patient's incision as well as place steri strips as ordered by MD. He tolerated it well.

## 2019-09-20 LAB — CULTURE, RESPIRATORY W GRAM STAIN
Culture: NORMAL
Gram Stain: NONE SEEN
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
